# Patient Record
Sex: Female | Born: 1950 | Race: White | Hispanic: No | Marital: Married | Smoking: Never smoker
Health system: Southern US, Community
[De-identification: ages and names within clinical notes are randomized; demographics above are authoritative.]

## PROBLEM LIST (undated history)

## (undated) DIAGNOSIS — F32A Depression, unspecified: Secondary | ICD-10-CM

## (undated) DIAGNOSIS — I10 Essential (primary) hypertension: Secondary | ICD-10-CM

## (undated) DIAGNOSIS — Z91030 Bee allergy status: Secondary | ICD-10-CM

## (undated) DIAGNOSIS — F329 Major depressive disorder, single episode, unspecified: Secondary | ICD-10-CM

## (undated) DIAGNOSIS — F419 Anxiety disorder, unspecified: Secondary | ICD-10-CM

## (undated) DIAGNOSIS — G43909 Migraine, unspecified, not intractable, without status migrainosus: Secondary | ICD-10-CM

## (undated) HISTORY — DX: Major depressive disorder, single episode, unspecified: F32.9

## (undated) HISTORY — DX: Bee allergy status: Z91.030

## (undated) HISTORY — DX: Anxiety disorder, unspecified: F41.9

## (undated) HISTORY — DX: Depression, unspecified: F32.A

## (undated) HISTORY — DX: Essential (primary) hypertension: I10

## (undated) HISTORY — DX: Migraine, unspecified, not intractable, without status migrainosus: G43.909

---

## 1997-11-25 HISTORY — PX: FOOT SURGERY: SHX648

## 2000-01-17 ENCOUNTER — Ambulatory Visit (HOSPITAL_COMMUNITY): Admission: RE | Admit: 2000-01-17 | Discharge: 2000-01-17 | Payer: Self-pay | Admitting: Family Medicine

## 2000-01-17 ENCOUNTER — Encounter: Payer: Self-pay | Admitting: Family Medicine

## 2002-07-30 ENCOUNTER — Encounter: Payer: Self-pay | Admitting: Family Medicine

## 2002-07-30 ENCOUNTER — Encounter: Admission: RE | Admit: 2002-07-30 | Discharge: 2002-07-30 | Payer: Self-pay | Admitting: Family Medicine

## 2004-04-24 ENCOUNTER — Encounter: Admission: RE | Admit: 2004-04-24 | Discharge: 2004-04-24 | Payer: Self-pay | Admitting: Family Medicine

## 2005-06-21 ENCOUNTER — Other Ambulatory Visit: Admission: RE | Admit: 2005-06-21 | Discharge: 2005-06-21 | Payer: Self-pay | Admitting: Family Medicine

## 2006-08-29 ENCOUNTER — Other Ambulatory Visit: Admission: RE | Admit: 2006-08-29 | Discharge: 2006-08-29 | Payer: Self-pay | Admitting: Family Medicine

## 2007-03-06 ENCOUNTER — Encounter: Admission: RE | Admit: 2007-03-06 | Discharge: 2007-03-06 | Payer: Self-pay | Admitting: Family Medicine

## 2007-07-18 ENCOUNTER — Emergency Department (HOSPITAL_COMMUNITY): Admission: EM | Admit: 2007-07-18 | Discharge: 2007-07-19 | Payer: Self-pay | Admitting: Emergency Medicine

## 2007-12-30 ENCOUNTER — Emergency Department (HOSPITAL_COMMUNITY): Admission: EM | Admit: 2007-12-30 | Discharge: 2007-12-30 | Payer: Self-pay | Admitting: Emergency Medicine

## 2010-07-06 ENCOUNTER — Other Ambulatory Visit: Admission: RE | Admit: 2010-07-06 | Discharge: 2010-07-06 | Payer: Self-pay | Admitting: Family Medicine

## 2011-08-16 LAB — I-STAT 8, (EC8 V) (CONVERTED LAB)
BUN: 10
Bicarbonate: 23.4
Glucose, Bld: 125 — ABNORMAL HIGH
Hemoglobin: 15.3 — ABNORMAL HIGH
TCO2: 24
pCO2, Ven: 24 — ABNORMAL LOW
pH, Ven: 7.597 — ABNORMAL HIGH

## 2011-08-16 LAB — RAPID URINE DRUG SCREEN, HOSP PERFORMED
Barbiturates: NOT DETECTED
Benzodiazepines: NOT DETECTED
Cocaine: NOT DETECTED
Opiates: NOT DETECTED

## 2011-08-16 LAB — POCT I-STAT CREATININE: Creatinine, Ser: 0.9

## 2011-09-06 LAB — URINALYSIS, ROUTINE W REFLEX MICROSCOPIC
Bilirubin Urine: NEGATIVE
Glucose, UA: NEGATIVE
Hgb urine dipstick: NEGATIVE
Protein, ur: NEGATIVE

## 2011-09-06 LAB — URINE MICROSCOPIC-ADD ON

## 2014-03-21 ENCOUNTER — Ambulatory Visit
Admission: RE | Admit: 2014-03-21 | Discharge: 2014-03-21 | Disposition: A | Discharge status: Home / Self Care | Payer: BC Managed Care – PPO | Source: Ambulatory Visit | Attending: Family Medicine | Admitting: Family Medicine

## 2014-03-21 ENCOUNTER — Other Ambulatory Visit: Payer: Self-pay | Admitting: Family Medicine

## 2014-03-21 DIAGNOSIS — M2550 Pain in unspecified joint: Secondary | ICD-10-CM

## 2015-05-02 ENCOUNTER — Other Ambulatory Visit: Payer: Self-pay | Admitting: Family Medicine

## 2015-05-02 ENCOUNTER — Other Ambulatory Visit (HOSPITAL_COMMUNITY)
Admission: RE | Admit: 2015-05-02 | Discharge: 2015-05-02 | Disposition: A | Discharge status: Home / Self Care | Payer: BC Managed Care – PPO | Source: Ambulatory Visit | Attending: Family Medicine | Admitting: Family Medicine

## 2015-05-02 DIAGNOSIS — Z01419 Encounter for gynecological examination (general) (routine) without abnormal findings: Secondary | ICD-10-CM | POA: Diagnosis present

## 2015-05-02 DIAGNOSIS — Z1151 Encounter for screening for human papillomavirus (HPV): Secondary | ICD-10-CM | POA: Diagnosis present

## 2015-05-04 LAB — CYTOLOGY - PAP

## 2015-05-15 ENCOUNTER — Other Ambulatory Visit: Payer: Self-pay | Admitting: Family Medicine

## 2015-05-15 DIAGNOSIS — Z1231 Encounter for screening mammogram for malignant neoplasm of breast: Secondary | ICD-10-CM

## 2015-05-31 ENCOUNTER — Ambulatory Visit
Admission: RE | Admit: 2015-05-31 | Discharge: 2015-05-31 | Disposition: A | Discharge status: Home / Self Care | Payer: BC Managed Care – PPO | Source: Ambulatory Visit | Attending: Family Medicine | Admitting: Family Medicine

## 2015-05-31 DIAGNOSIS — Z1231 Encounter for screening mammogram for malignant neoplasm of breast: Secondary | ICD-10-CM

## 2016-06-10 ENCOUNTER — Other Ambulatory Visit: Payer: Self-pay | Admitting: Oncology

## 2018-02-05 ENCOUNTER — Encounter: Payer: Self-pay | Admitting: Allergy & Immunology

## 2018-02-05 ENCOUNTER — Ambulatory Visit: Payer: BC Managed Care – PPO | Admitting: Allergy & Immunology

## 2018-02-05 VITALS — BP 120/72 | HR 60 | Resp 16 | Ht 61.5 in | Wt 132.8 lb

## 2018-02-05 DIAGNOSIS — J3089 Other allergic rhinitis: Secondary | ICD-10-CM

## 2018-02-05 DIAGNOSIS — J302 Other seasonal allergic rhinitis: Secondary | ICD-10-CM

## 2018-02-05 NOTE — Progress Notes (Signed)
NEW PATIENT  Date of Service/Encounter:  02/05/18  Referring provider: Merri Brunette, MD   Assessment:   Seasonal and perennial allergic rhinitis (grasses, outdoor molds, dog and cockroach)  Fatigue  Plan/Recommendations:   1. Seasonal and perennial allergic rhinitis - Testing today showed: grasses, outdoor molds, dog and cockroach - Avoidance measures provided. - Continue with: Claritin (loratadine) 10mg  tablet once daily as needed. - You can use an extra dose of the antihistamine, if needed, for breakthrough symptoms.  - Consider nasal saline rinses 1-2 times daily to remove allergens from the nasal cavities as well as help with mucous clearance (this is especially helpful to do before the nasal sprays are given) - Although Ms. Southgate is allergic to mold, they are all of the outdoor variety and not the indoor variety. - Ms. Yellin also seems to be rather sensitive to scents and other fragrances, which result in marked congestion and headaches.  - Consider the use of room air purifier with a HEPA filter to try to control your local environment.  - We are not treating with anything else aside from air purification and Claritin since Ms. Aspinwall is not interested in any medications whatsoever aside from antihistamines.   2. Return if symptoms worsen or fail to improve.  Subjective:   Sherri Atkins is a 67 y.o. female presenting today for evaluation of  Chief Complaint  Patient presents with  . Nasal Congestion  . Burning Eyes  . Nausea  . Headache    Sherri Atkins has a history of the following: There are no active problems to display for this patient.   History obtained from: chart review and patient.  Sherri Atkins was referred by Merri Brunette, MD.     Sherri Atkins is a 67 y.o. female presenting for an allergy evaluation. 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 She is currently working at [employer]. This past Monday  March 4th, there was an unusual smell in the building. It was very nauseating It smelled like a gas like, eucalyptus, cat litter scent. This seemed to make her head become congested. She had a headache and immediate fatigue. This week she ended up taking a sick day every other day. Symptoms recovered once she got home. She had to go outside to "clear [her] lungs". Then on Friday of that week she stayed all day and had no problems. She did feel badly in the morning but got better in the afternoon. She has felt better this week in general. She did report some breathing problems during this time, but she has no history of asthma whatsoever. She has gotten migraines from mold in the past, but typically it is just a headache.  [employer] does have some leaking in the building. 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 There are others in the building who have experienced similar symptoms. She has worked there for around seven months now. She was previously at [employer]. 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 She tends to follow her students around the school system.   She did not treat it with any medications at all since she did not know the etiology of the symptoms. When she does have some sinus pain, she tries to drink hot tea, ginger, and mint. She tries to stay away from the OTC allergy medications because they tend to dry her out. She has been using natural remedies. She tolerates all of the major food  allergens without adverse event.   Otherwise, there is no history of other atopic diseases, including asthma, drug allergies, food allergies, stinging insect allergies, or urticaria. There is no significant infectious history. Vaccinations are up to date.    Past Medical History: There are no active problems to display for this patient.   Medication List:  Allergies as of 02/05/2018      Reactions   Doxycycline Nausea Only   Lisinopril Swelling   Rexulti  [brexpiprazole] Other (See Comments)   Memory loss   Septra [sulfamethoxazole-trimethoprim] Other (See Comments)   heartburn   Neosporin [neomycin-bacitracin Zn-polymyx] Rash      Medication List        Accurate as of 02/05/18  9:29 PM. Always use your most recent med list.          amLODipine 5 MG tablet Commonly known as:  NORVASC Take 5 mg by mouth daily.   BENEFIBER DRINK MIX PO Take 1 Dose by mouth as needed.   CALCIUM-VITAMINS C & D PO Take 1 tablet by mouth daily.   EPIPEN 2-PAK 0.3 mg/0.3 mL Soaj injection Generic drug:  EPINEPHrine Inject 0.3 mg into the muscle once.   FISH OIL PO Take 1 capsule by mouth daily.   hydrochlorothiazide 12.5 MG capsule Commonly known as:  MICROZIDE Take 12.5 mg by mouth daily.   MAGNESIA PO Take 2 tablets by mouth daily.   naproxen 250 MG tablet Commonly known as:  NAPROSYN Take by mouth 2 (two) times daily with a meal.   rizatriptan 10 MG tablet Commonly known as:  MAXALT Take 10 mg by mouth as needed for migraine. May repeat in 2 hours if needed   sertraline 100 MG tablet Commonly known as:  ZOLOFT Take 100 mg by mouth daily.   traZODone 25 mg Tabs tablet Commonly known as:  DESYREL Take 25 mg by mouth at bedtime.   ZINC PO Take 1 tablet by mouth daily.       Birth History: non-contributory.   Developmental History: non-contributory.   Past Surgical History: Past Surgical History:  Procedure Laterality Date  . FOOT SURGERY  1999     Family History: Family History  Problem Relation Age of Onset  . Depression Mother   . Hernia Mother   . Constipation Mother   . Dementia Mother   . Lung cancer Father   . Depression Sister   . Asthma Brother   . Esophageal cancer Brother   . Depression Sister    Ms. Slomski's son is allergic to cashews. Her grand-daughter is allergic to tree nuts.    Social History: Sherri Atkins lives at home with her husband. She works with hearing impaired students within the  school system. Her husband works from home. There is wood flooring in the main living areas. They here is gas heating and central cooling. There is one cat in the home. There are no dust mite coverings on her bedding. There is no tobacco exposure in the home. 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     Review of Systems: a 14-point review of systems is pertinent for what is mentioned in HPI.  Otherwise, all other systems were negative. Constitutional: negative other than that listed in the HPI Eyes: negative other than that listed in the HPI Ears, nose, mouth, throat, and face: negative other than that listed in the HPI Respiratory: negative other than that listed in the HPI Cardiovascular: negative other than that listed  in the HPI Gastrointestinal: negative other than that listed in the HPI Genitourinary: negative other than that listed in the HPI Integument: negative other than that listed in the HPI Hematologic: negative other than that listed in the HPI Musculoskeletal: negative other than that listed in the HPI Neurological: negative other than that listed in the HPI Allergy/Immunologic: negative other than that listed in the HPI    Objective:   Blood pressure 120/72, pulse 60, resp. rate 16, height 5' 1.5" (1.562 m), weight 132 lb 12.8 oz (60.2 kg). Body mass index is 24.69 kg/m.   Physical Exam:  General: Alert, interactive, in no acute distress. Pleasant female                                                                                       2Eyes: No conjunctival injection bilaterally, no discharge on the right, no discharge on the left and no Horner-Trantas dots present. PERRL bilaterally. EOMI without pain. No photophobia.  Ears: Right TM pearly gray with normal light reflex, Left TM pearly gray with normal light reflex, Right TM intact without perforation and Left TM intact without perforation.  Nose/Throat: External nose  within normal limits and septum midline. Turbinates minimally edematous without discharge. Posterior oropharynx mildly erythematous without cobblestoning in the posterior oropharynx. Tonsils 2+ without exudates.  Tongue without thrush. Neck: Supple without thyromegaly. Trachea midline. Adenopathy: no enlarged lymph nodes appreciated in the anterior cervical, occipital, axillary, epitrochlear, inguinal, or popliteal regions. Lungs: Clear to auscultation without wheezing, rhonchi or rales. No increased work of breathing. CV: Normal S1/S2. No murmurs. Capillary refill <2 seconds.  Abdomen: Nondistended, nontender. No guarding or rebound tenderness. Bowel sounds 2  Skin: Warm and dry, without lesions or rashes. Extremities:  No clubbing, cyanosis or edema. Neuro:   Grossly intact. No focal deficits appreciated. Responsive to questions.  Diagnostic studies:\   Allergy Studies:   Indoor/Outdoor Percutaneous Adult Environmental Panel:negative to the entire panel with adequate controls.  Indoor/Outdoor Percutaneous Pediatric Environmental Panel:negative to the entire panel with adequate controls.  Indoor/Outdoor Selected Intradermal Environmental Panel: positive to [place] grass, Grass mix, mold mix #3, dog and cockroach. Otherwise negative with adequate controls.  Allergy testing results were read and interpreted by myself, documented by clinical staff.     Malachi BondsJoel Angila Wombles, MD [facility]

## 2018-02-05 NOTE — Patient Instructions (Addendum)
1. Seasonal and perennial allergic rhinitis - Testing today showed: grasses, outdoor molds, dog and cockroach - Avoidance measures provided. - Continue with: Claritin (loratadine) 10mg  tablet once daily as needed. - You can use an extra dose of the antihistamine, if needed, for breakthrough symptoms.  - Consider nasal saline rinses 1-2 times daily to remove allergens from the nasal cavities as well as help with mucous clearance (this is especially helpful to do before the nasal sprays are given) - Consider the use of room air purifier with a HEPA filter to try to control your local environment.   2. Return if symptoms worsen or fail to improve.   Please inform us of any Emergency Department visits, hospitalizations, or changes in symptoms. Call us before going to the ED for breathing or allergy symptoms since we might be able to fit you in for a sick visit. Feel free to contact us anytime with any questions, problems, or concerns.  It was a pleasure to meet you today!  Websites that have reliable patient information: 1. American Academy of Asthma, Allergy, and Immunology: www.aaaai.org 2. Food Allergy Research and Education (FARE): foodallergy.org 3. Mothers of Asthmatics: http://www.asthmacommunitynetwork.org 4. American College of Allergy, Asthma, and Immunology: www.acaai.org  Reducing Pollen Exposure  The American Academy of Allergy, Asthma and Immunology suggests the following steps to reduce your exposure to pollen during allergy seasons.    1. Do not hang sheets or clothing out to dry; pollen may collect on these items. 2. Do not mow lawns or spend time around freshly cut grass; mowing stirs up pollen. 3. Keep windows closed at night.  Keep car windows closed while driving. 4. Minimize morning activities outdoors, a time when pollen counts are usually at their highest. 5. Stay indoors as much as possible when pollen counts or humidity is high and on windy days when pollen tends to  remain in the air longer. 6. Use air conditioning when possible.  Many air conditioners have filters that trap the pollen spores. 7. Use a HEPA room air filter to remove pollen form the indoor air you breathe.  Control of Mold Allergen   Mold and fungi can grow on a variety of surfaces provided certain temperature and moisture conditions exist.  Outdoor molds grow on plants, decaying vegetation and soil.  The major outdoor mold, Alternaria and Cladosporium, are found in very high numbers during hot and dry conditions.  Generally, a late Summer - Fall peak is seen for common outdoor fungal spores.  Rain will temporarily lower outdoor mold spore count, but counts rise rapidly when the rainy period ends.  The most important indoor molds are Aspergillus and Penicillium.  Dark, humid and poorly ventilated basements are ideal sites for mold growth.  The next most common sites of mold growth are the bathroom and the kitchen.  Outdoor (Seasonal) Mold Control  Positive outdoor molds via skin testing: Alternaria, Cladosporium, Bipolaris (Helminthsporium), Drechslera (Curvalaria) and Mucor  1. Use air conditioning and keep windows closed 2. Avoid exposure to decaying vegetation. 3. Avoid leaf raking. 4. Avoid grain handling. 5. Consider wearing a face mask if working in moldy areas.    Control of Cockroach Allergen  Cockroach allergen has been identified as an important cause of acute attacks of asthma, especially in urban settings.  There are fifty-five species of cockroach that exist in the [place], however only three, the [place], [place] and Oriental species produce allergen that can affect patients with Asthma.  Allergens can be obtained from  fecal particles, egg casings and secretions from cockroaches.    1. Remove food sources. 2. Reduce access to water. 3. Seal access and entry points. 4. Spray runways with 0.5-1% Diazinon or Chlorpyrifos 5. Blow boric acid power under stoves and  refrigerator. 6. Place bait stations (hydramethylnon) at feeding sites.  Control of Dog or Cat Allergen  Avoidance is the best way to manage a dog or cat allergy. If you have a dog or cat and are allergic to dog or cats, consider removing the dog or cat from the home. If you have a dog or cat but don't want to find it a new home, or if your family wants a pet even though someone in the household is allergic, here are some strategies that may help keep symptoms at bay:  1. Keep the pet out of your bedroom and restrict it to only a few rooms. Be advised that keeping the dog or cat in only one room will not limit the allergens to that room. 2. Don't pet, hug or kiss the dog or cat; if you do, wash your hands with soap and water. 3. High-efficiency particulate air (HEPA) cleaners run continuously in a bedroom or living room can reduce allergen levels over time. 4. Regular use of a high-efficiency vacuum cleaner or a central vacuum can reduce allergen levels. 5. Giving your dog or cat a bath at least once a week can reduce airborne allergen.

## 2018-12-18 ENCOUNTER — Other Ambulatory Visit: Payer: Self-pay | Admitting: Family Medicine

## 2018-12-18 DIAGNOSIS — M8580 Other specified disorders of bone density and structure, unspecified site: Secondary | ICD-10-CM

## 2018-12-31 ENCOUNTER — Other Ambulatory Visit: Payer: Self-pay | Admitting: Family Medicine

## 2018-12-31 DIAGNOSIS — Z1231 Encounter for screening mammogram for malignant neoplasm of breast: Secondary | ICD-10-CM

## 2019-02-25 ENCOUNTER — Other Ambulatory Visit: Payer: BC Managed Care – PPO

## 2019-02-25 ENCOUNTER — Ambulatory Visit: Payer: BC Managed Care – PPO

## 2019-05-12 ENCOUNTER — Ambulatory Visit
Admission: RE | Admit: 2019-05-12 | Discharge: 2019-05-12 | Disposition: A | Discharge status: Home / Self Care | Payer: BC Managed Care – PPO | Source: Ambulatory Visit | Attending: Family Medicine | Admitting: Family Medicine

## 2019-05-12 ENCOUNTER — Other Ambulatory Visit: Payer: BC Managed Care – PPO

## 2019-05-12 ENCOUNTER — Other Ambulatory Visit: Payer: Self-pay

## 2019-05-12 DIAGNOSIS — Z1231 Encounter for screening mammogram for malignant neoplasm of breast: Secondary | ICD-10-CM

## 2019-06-17 ENCOUNTER — Other Ambulatory Visit: Payer: Self-pay

## 2019-06-17 ENCOUNTER — Ambulatory Visit
Admission: RE | Admit: 2019-06-17 | Discharge: 2019-06-17 | Disposition: A | Discharge status: Home / Self Care | Payer: BC Managed Care – PPO | Source: Ambulatory Visit | Attending: Family Medicine | Admitting: Family Medicine

## 2019-06-17 DIAGNOSIS — M8580 Other specified disorders of bone density and structure, unspecified site: Secondary | ICD-10-CM

## 2019-12-20 ENCOUNTER — Other Ambulatory Visit: Payer: Self-pay | Admitting: Family Medicine

## 2019-12-20 ENCOUNTER — Other Ambulatory Visit (HOSPITAL_COMMUNITY)
Admission: RE | Admit: 2019-12-20 | Discharge: 2019-12-20 | Disposition: A | Discharge status: Home / Self Care | Payer: Medicare HMO | Source: Ambulatory Visit | Attending: Family Medicine | Admitting: Family Medicine

## 2019-12-20 DIAGNOSIS — M19041 Primary osteoarthritis, right hand: Secondary | ICD-10-CM | POA: Diagnosis not present

## 2019-12-20 DIAGNOSIS — M19042 Primary osteoarthritis, left hand: Secondary | ICD-10-CM | POA: Diagnosis not present

## 2019-12-20 DIAGNOSIS — Z0000 Encounter for general adult medical examination without abnormal findings: Secondary | ICD-10-CM | POA: Diagnosis not present

## 2019-12-20 DIAGNOSIS — Z124 Encounter for screening for malignant neoplasm of cervix: Secondary | ICD-10-CM | POA: Insufficient documentation

## 2019-12-20 DIAGNOSIS — Z1151 Encounter for screening for human papillomavirus (HPV): Secondary | ICD-10-CM | POA: Insufficient documentation

## 2019-12-20 DIAGNOSIS — I10 Essential (primary) hypertension: Secondary | ICD-10-CM | POA: Diagnosis not present

## 2019-12-20 DIAGNOSIS — Z1211 Encounter for screening for malignant neoplasm of colon: Secondary | ICD-10-CM | POA: Diagnosis not present

## 2019-12-20 DIAGNOSIS — F339 Major depressive disorder, recurrent, unspecified: Secondary | ICD-10-CM | POA: Diagnosis not present

## 2019-12-20 DIAGNOSIS — R079 Chest pain, unspecified: Secondary | ICD-10-CM | POA: Diagnosis not present

## 2019-12-20 DIAGNOSIS — Z7189 Other specified counseling: Secondary | ICD-10-CM | POA: Diagnosis not present

## 2019-12-22 LAB — CYTOLOGY - PAP
Comment: NEGATIVE
Diagnosis: NEGATIVE
High risk HPV: NEGATIVE

## 2019-12-28 ENCOUNTER — Telehealth: Payer: Self-pay | Admitting: *Deleted

## 2019-12-28 NOTE — Telephone Encounter (Signed)
Left  Message,re: her new patient appointment.

## 2020-01-09 NOTE — Progress Notes (Signed)
Cardiology Office Note:    Date:  01/10/2020   ID:  Sherri Atkins, DOB 14-Apr-1951, MRN [id number]  PCP:  Sherri Brunette, MD  Cardiologist:  No primary care provider on file.  Electrophysiologist:  None   Referring MD: Sherri Montana, MD   Chief Complaint  Patient presents with  . Chest Pain    History of Present Illness:    Sherri Atkins is a 69 y.o. female with a hx of hypertension, anxiety, depression who is referred by Dr. Cliffton Asters for evaluation of chest pain.  She reports that chest pain started in the last few months.  Describes pressure in center of her chest, occurs when she is stressed.  Also feels short of breath.  Has been occurring a couple times per week.  In addition she has a second type of chest pain that she describes as occasional dull aching pain.  For exercise, she does gardening and will walk for 20 to 40 minutes about twice per week.  She denies any exertional symptoms.  Prescribed omeprazole for GERD, but has not started yet.  Reports BP under good control.  LDL 115 on 12/20/19.  Never smoked.  No family history heart disease.    Past Medical History:  Diagnosis Date  . Anxiety   . Bee sting allergy   . Depression   . High blood pressure   . Migraines     Past Surgical History:  Procedure Laterality Date  . FOOT SURGERY  1999    Current Medications: Current Meds  Medication Sig  . amLODipine (NORVASC) 5 MG tablet Take 5 mg by mouth daily.  Marland Kitchen CALCIUM-VITAMINS C & D PO Take 1 tablet by mouth daily.  Marland Kitchen EPINEPHrine (EPIPEN 2-PAK) 0.3 mg/0.3 mL IJ SOAJ injection Inject 0.3 mg into the muscle once.  . hydrochlorothiazide (MICROZIDE) 12.5 MG capsule Take 12.5 mg by mouth daily.  . Multiple Vitamins-Minerals (ZINC PO) Take 1 tablet by mouth daily.  . naproxen (NAPROSYN) 250 MG tablet Take by mouth 2 (two) times daily with a meal.  . Omega-3 Fatty Acids (FISH OIL PO) Take 1 capsule by mouth daily.  . rizatriptan (MAXALT) 10 MG tablet Take 10 mg by mouth  as needed for migraine. May repeat in 2 hours if needed  . sertraline (ZOLOFT) 100 MG tablet Take 100 mg by mouth daily.  . traZODone (DESYREL) 25 mg TABS tablet Take 25 mg by mouth at bedtime.  . Wheat Dextrin (BENEFIBER DRINK MIX PO) Take 1 Dose by mouth as needed.     Allergies:   Doxycycline, Lisinopril, Rexulti [brexpiprazole], Septra [sulfamethoxazole-trimethoprim], and Neosporin [neomycin-bacitracin zn-polymyx]   Social History   Socioeconomic History  . Marital status: Legally Separated    Spouse name: Not on file  . Number of children: Not on file  . Years of education: Not on file  . Highest education level: Not on file  Occupational History  . Not on file  Tobacco Use  . Smoking status: Never Smoker  . Smokeless tobacco: Never Used  Substance and Sexual Activity  . Alcohol use: No  . Drug use: No  . Sexual activity: Not on file  Other Topics Concern  . Not on file  Social History Narrative  . Not on file   Social Determinants of Health   Financial Resource Strain:   . Difficulty of Paying Living Expenses: Not on file  Food Insecurity:   . Worried About Programme researcher, broadcasting/film/video in the Last Year: Not on file  .  Ran Out of Food in the Last Year: Not on file  Transportation Needs:   . Lack of Transportation (Medical): Not on file  . Lack of Transportation (Non-Medical): Not on file  Physical Activity:   . Days of Exercise per Week: Not on file  . Minutes of Exercise per Session: Not on file  Stress:   . Feeling of Stress : Not on file  Social Connections:   . Frequency of Communication with Friends and Family: Not on file  . Frequency of Social Gatherings with Friends and Family: Not on file  . Attends Religious Services: Not on file  . Active Member of Clubs or Organizations: Not on file  . Attends Banker Meetings: Not on file  . Marital Status: Not on file     Family History: The patient's family history includes Asthma in her brother;  Constipation in her mother; Dementia in her mother; Depression in her mother, sister, and sister; Esophageal cancer in her brother; Hernia in her mother; Lung cancer in her father.  ROS:   Please see the history of present illness.     All other systems reviewed and are negative.  EKGs/Labs/Other Studies Reviewed:    The following studies were reviewed today:   EKG:  EKG is ordered today.  The ekg ordered today demonstrates normal sinus rhythm, rate 65, no ST/T abnormalities  Recent Labs: No results found for requested labs within last 8760 hours.  Recent Lipid Panel No results found for: CHOL, TRIG, HDL, CHOLHDL, VLDL, LDLCALC, LDLDIRECT  Physical Exam:    VS:  BP 122/78 (BP Location: Left Arm)   Pulse 66   Ht 5\' 2"  (1.575 m)   Wt 135 lb 9.6 oz (61.5 kg)   SpO2 99%   BMI 24.80 kg/m     Wt Readings from Last 3 Encounters:  01/10/20 135 lb 9.6 oz (61.5 kg)  02/05/18 132 lb 12.8 oz (60.2 kg)     GEN:  Well nourished, well developed in no acute distress HEENT: Normal NECK: No JVD; No carotid bruits CARDIAC: RRR, no murmurs, rubs, gallops RESPIRATORY:  Clear to auscultation without rales, wheezing or rhonchi  ABDOMEN: Soft, non-tender, non-distended MUSCULOSKELETAL:  No edema; No deformity  SKIN: Warm and dry NEUROLOGIC:  Alert and oriented x 3 PSYCHIATRIC:  Normal affect   ASSESSMENT:    1. Chest pain, unspecified type   2. Pre-procedure lab exam   3. Essential hypertension    PLAN:    In order of problems listed above:  Chest pain: Atypical in description, but given risk factors (age, hypertension) would classify as intermediate risk of obstructive coronary disease and warrants further evaluation. -Coronary CTA -TTE  Hypertension: On amlodipine 5 mg daily and hydrochlorothiazide 12.5 mg daily.  Appears well controlled  RTC in 3 months.  Medication Adjustments/Labs and Tests Ordered: Current medicines are reviewed at length with the patient today.  Concerns  regarding medicines are outlined above.  Orders Placed This Encounter  Procedures  . CT CORONARY MORPH W/CTA COR W/SCORE W/CA W/CM &/OR WO/CM  . CT CORONARY FRACTIONAL FLOW RESERVE DATA PREP  . CT CORONARY FRACTIONAL FLOW RESERVE FLUID ANALYSIS  . Basic metabolic panel  . EKG 12-Lead  . ECHOCARDIOGRAM COMPLETE   Meds ordered this encounter  Medications  . metoprolol tartrate (LOPRESSOR) 50 MG tablet    Sig: Take 50 mg (1 tablet) TWO hours prior to CT    Dispense:  1 tablet    Refill:  0  Patient Instructions  Medication Instructions:  Your physician recommends that you continue on your current medications as directed. Please refer to the Current Medication list given to you today.  *If you need a refill on your cardiac medications before your next appointment, please call your pharmacy*  Lab Work: BMET ONE [place]  If you have labs (blood work) drawn today and your tests are completely normal, you will receive your results only by: Marland Kitchen MyChart Message (if you have MyChart) OR . A paper copy in the mail If you have any lab test that is abnormal or we need to change your treatment, we will call you to review the results.  Testing/Procedures: Your physician has requested that you have an echocardiogram. Echocardiography is a painless test that uses sound waves to create images of your heart. It provides your doctor with information about the size and shape of your heart and how well your heart's chambers and valves are working. This procedure takes approximately one hour. There are no restrictions for this procedure.  This will be done at our [facility] location:  [place] has requested that you have cardiac CT. Cardiac computed tomography (CT) is a painless test that uses an x-ray machine to take clear, detailed pictures of your heart. For further information please visit HugeFiesta.tn. Please follow instruction sheet as  given.  Follow-Up: At [facility], you and your health needs are our priority.  As part of our continuing mission to provide you with exceptional heart care, we have created designated Provider Care Teams.  These Care Teams include your primary Cardiologist (physician) and Advanced Practice Providers (APPs -  Physician Assistants and Nurse Practitioners) who all work together to provide you with the care you need, when you need it.  Your next appointment:   3 month(s)  The format for your next appointment:   In Person  Provider:   Oswaldo Milian, MD  Other Instructions   Your cardiac CT will be scheduled at one of the below locations:   [facility] [address] [place], [place] [address] [phone]  [place] [address] [place], [place] [address] [phone]  If scheduled at [facility], please arrive at the [facility] main entrance of [facility] 30 minutes prior to test start time. Proceed to the [facility] Radiology Department (first floor) to check-in and test prep.  If scheduled at [facility], please arrive 15 mins early for check-in and test prep.  Please follow these instructions carefully (unless otherwise directed):  Hold all erectile dysfunction medications at least 3 days (72 hrs) prior to test.  On the Night Before the Test: . Be sure to Drink plenty of water. . Do not consume any caffeinated/decaffeinated beverages or chocolate 12 hours prior to your test. . Do not take any antihistamines 12 hours prior to your test. . If you take Metformin do not take 24 hours prior to test. . If the patient has contrast allergy: ? Patient will need a prescription for Prednisone and very clear instructions (as follows): 1. Prednisone 50 mg - take 13 hours prior to test 2. Take another Prednisone 50 mg 7 hours prior to test 3. Take another  Prednisone 50 mg 1 hour prior to test 4. Take Benadryl 50 mg 1 hour prior to test . Patient must complete all four doses of above prophylactic  medications. . Patient will need a ride after test due to Benadryl.  On the Day of the Test: . Drink plenty of water. Do not drink any water within one hour of the test. . Do not eat any food 4 hours prior to the test. . You may take your regular medications prior to the test.  . Take metoprolol (Lopressor) two hours prior to test. . HOLD Furosemide/Hydrochlorothiazide morning of the test. . FEMALES- please wear underwire-free bra if available   After the Test: . Drink plenty of water. . After receiving IV contrast, you may experience a mild flushed feeling. This is normal. . On occasion, you may experience a mild rash up to 24 hours after the test. This is not dangerous. If this occurs, you can take Benadryl 25 mg and increase your fluid intake. . If you experience trouble breathing, this can be serious. If it is severe call 911 IMMEDIATELY. If it is mild, please call our office. . If you take any of these medications: Glipizide/Metformin, Avandament, Glucavance, please do not take 48 hours after completing test unless otherwise instructed.   Once we have confirmed authorization from your insurance company, we will call you to set up a date and time for your test.   For non-scheduling related questions, please contact the cardiac imaging nurse navigator should you have any questions/concerns: Rockwell Alexandria, RN Navigator Cardiac Imaging [facility] [phone] mobile       Signed, [name], MD  01/10/2020 9:36 AM    [facility]

## 2020-01-10 ENCOUNTER — Encounter (INDEPENDENT_AMBULATORY_CARE_PROVIDER_SITE_OTHER): Payer: Self-pay

## 2020-01-10 ENCOUNTER — Other Ambulatory Visit: Payer: Self-pay

## 2020-01-10 ENCOUNTER — Encounter: Payer: Self-pay | Admitting: Cardiology

## 2020-01-10 ENCOUNTER — Ambulatory Visit (HOSPITAL_COMMUNITY): Payer: Medicare HMO | Attending: Cardiology

## 2020-01-10 ENCOUNTER — Ambulatory Visit: Payer: Medicare HMO | Admitting: Cardiology

## 2020-01-10 VITALS — BP 122/78 | HR 66 | Ht 62.0 in | Wt 135.6 lb

## 2020-01-10 DIAGNOSIS — Z01812 Encounter for preprocedural laboratory examination: Secondary | ICD-10-CM

## 2020-01-10 DIAGNOSIS — I10 Essential (primary) hypertension: Secondary | ICD-10-CM

## 2020-01-10 DIAGNOSIS — R079 Chest pain, unspecified: Secondary | ICD-10-CM | POA: Diagnosis not present

## 2020-01-10 LAB — ECHOCARDIOGRAM COMPLETE
Height: 62 in
Weight: 2169.6 oz

## 2020-01-10 MED ORDER — METOPROLOL TARTRATE 50 MG PO TABS: ORAL_TABLET | ORAL | 0 refills | Status: DC

## 2020-01-10 NOTE — Patient Instructions (Addendum)
Medication Instructions:  Your physician recommends that you continue on your current medications as directed. Please refer to the Current Medication list given to you today.  *If you need a refill on your cardiac medications before your next appointment, please call your pharmacy*  Lab Work: BMET ONE [place]  If you have labs (blood work) drawn today and your tests are completely normal, you will receive your results only by: Marland Kitchen MyChart Message (if you have MyChart) OR . A paper copy in the mail If you have any lab test that is abnormal or we need to change your treatment, we will call you to review the results.  Testing/Procedures: Your physician has requested that you have an echocardiogram. Echocardiography is a painless test that uses sound waves to create images of your heart. It provides your doctor with information about the size and shape of your heart and how well your heart's chambers and valves are working. This procedure takes approximately one hour. There are no restrictions for this procedure.  This will be done at our [facility] location:  [place] has requested that you have cardiac CT. Cardiac computed tomography (CT) is a painless test that uses an x-ray machine to take clear, detailed pictures of your heart. For further information please visit HugeFiesta.tn. Please follow instruction sheet as given.  Follow-Up: At [facility], you and your health needs are our priority.  As part of our continuing mission to provide you with exceptional heart care, we have created designated Provider Care Teams.  These Care Teams include your primary Cardiologist (physician) and Advanced Practice Providers (APPs -  Physician Assistants and Nurse Practitioners) who all work together to provide you with the care you need, when you need it.  Your next appointment:   3 month(s)  The format for your next appointment:   In  Person  Provider:   Oswaldo Milian, MD  Other Instructions   Your cardiac CT will be scheduled at one of the below locations:   [facility] [address] [place], [place] [address] [phone]  [place] [address] [place], [place] [address] [phone]  If scheduled at [facility], please arrive at the [facility] main entrance of [facility] 30 minutes prior to test start time. Proceed to the [facility] Radiology Department (first floor) to check-in and test prep.  If scheduled at [facility], please arrive 15 mins early for check-in and test prep.  Please follow these instructions carefully (unless otherwise directed):  Hold all erectile dysfunction medications at least 3 days (72 hrs) prior to test.  On the Night Before the Test: . Be sure to Drink plenty of water. . Do not consume any caffeinated/decaffeinated beverages or chocolate 12 hours prior to your test. . Do not take any antihistamines 12 hours prior to your test. . If you take Metformin do not take 24 hours prior to test. . If the patient has contrast allergy: ? Patient will need a prescription for Prednisone and very clear instructions (as follows): 1. Prednisone 50 mg - take 13 hours prior to test 2. Take another Prednisone 50 mg 7 hours prior to test 3. Take another Prednisone 50 mg 1 hour prior to test 4. Take Benadryl 50 mg 1 hour prior to test . Patient must complete all four doses of above prophylactic medications. . Patient will  need a ride after test due to Benadryl.  On the Day of the Test: . Drink plenty of water. Do not drink any water within one hour of the test. . Do not eat any food 4 hours prior to the test. . You may take your regular medications prior to the test.  . Take metoprolol (Lopressor) two hours prior to test. . HOLD Furosemide/Hydrochlorothiazide morning of  the test. . FEMALES- please wear underwire-free bra if available   After the Test: . Drink plenty of water. . After receiving IV contrast, you may experience a mild flushed feeling. This is normal. . On occasion, you may experience a mild rash up to 24 hours after the test. This is not dangerous. If this occurs, you can take Benadryl 25 mg and increase your fluid intake. . If you experience trouble breathing, this can be serious. If it is severe call 911 IMMEDIATELY. If it is mild, please call our office. . If you take any of these medications: Glipizide/Metformin, Avandament, Glucavance, please do not take 48 hours after completing test unless otherwise instructed.   Once we have confirmed authorization from your insurance company, we will call you to set up a date and time for your test.   For non-scheduling related questions, please contact the cardiac imaging nurse navigator should you have any questions/concerns: Rockwell Alexandria, RN Navigator Cardiac Imaging [facility] [phone] mobile

## 2020-01-26 DIAGNOSIS — F411 Generalized anxiety disorder: Secondary | ICD-10-CM | POA: Diagnosis not present

## 2020-01-26 DIAGNOSIS — Z630 Problems in relationship with spouse or partner: Secondary | ICD-10-CM | POA: Diagnosis not present

## 2020-01-26 DIAGNOSIS — F332 Major depressive disorder, recurrent severe without psychotic features: Secondary | ICD-10-CM | POA: Diagnosis not present

## 2020-02-08 ENCOUNTER — Telehealth: Payer: Self-pay | Admitting: Cardiology

## 2020-02-08 DIAGNOSIS — Z01812 Encounter for preprocedural laboratory examination: Secondary | ICD-10-CM | POA: Diagnosis not present

## 2020-02-08 DIAGNOSIS — R079 Chest pain, unspecified: Secondary | ICD-10-CM | POA: Diagnosis not present

## 2020-02-08 NOTE — Telephone Encounter (Signed)
Lm to call back ./cy 

## 2020-02-08 NOTE — Telephone Encounter (Signed)
Patient returning call.

## 2020-02-08 NOTE — Telephone Encounter (Signed)
Contacted patient, she had questions regarding her BMET (she did not have to fast for this), and her CT coming up.  I answered questions per patient.  She was thankful for the call. No other questions at this time.

## 2020-02-08 NOTE — Telephone Encounter (Signed)
Follow Up:   Pt said she had lab work this morning here at the lab. She said she took her blood pressure medicine with a sip of ginger ale. She wants to know if that was alright?

## 2020-02-09 ENCOUNTER — Telehealth: Payer: Self-pay | Admitting: Cardiology

## 2020-02-09 LAB — BASIC METABOLIC PANEL
BUN/Creatinine Ratio: 21 (ref 12–28)
BUN: 19 mg/dL (ref 8–27)
CO2: 26 mmol/L (ref 20–29)
Calcium: 9.8 mg/dL (ref 8.7–10.3)
Chloride: 102 mmol/L (ref 96–106)
Creatinine, Ser: 0.89 mg/dL (ref 0.57–1.00)
GFR calc Af Amer: 78 mL/min/{1.73_m2} (ref >59)
GFR calc non Af Amer: 67 mL/min/{1.73_m2} (ref >59)
Glucose: 81 mg/dL (ref 65–99)
Potassium: 5.1 mmol/L (ref 3.5–5.2)
Sodium: 141 mmol/L (ref 134–144)

## 2020-02-09 NOTE — Telephone Encounter (Signed)
Spoke with patient. Patient reports she may have read the caller ID wrong, believes the call was from yesterday but she already spoke with someone. No further questions at this time.

## 2020-02-09 NOTE — Telephone Encounter (Signed)
Patient states she is returning a call from this morning. She is unsure what it was about.

## 2020-02-15 ENCOUNTER — Encounter (HOSPITAL_COMMUNITY): Payer: Self-pay

## 2020-02-15 ENCOUNTER — Telehealth: Payer: Self-pay | Admitting: Cardiology

## 2020-02-15 NOTE — Telephone Encounter (Signed)
Returning phone call after patient called with questions about medications to take prior to CCTA appt tomorrow. States she went to pharm who said they did not receive rx for prednisone.  I explained to patient that prednisone/benadryl is only for patients who are allergic to IV contrast. I confirmed her allergies as listed in chart (which are up to date) and patient has received contrast in the past without issues.  Pt appreciated the follow up call.  Rockwell Alexandria RN Navigator Cardiac Imaging [facility] [phone] Office  [phone] Cell

## 2020-02-15 NOTE — Telephone Encounter (Signed)
  Pt said per instructions for her upcoming CT tomorrow she needs to take prednisone, however, she went and been calling her pharmacy and no prednisone was sent for her. She wanted to know if she needs to take for the test, also, she's confused if she needs to take benadryl before the CT.  Please call

## 2020-02-16 ENCOUNTER — Encounter (HOSPITAL_COMMUNITY): Payer: Self-pay

## 2020-02-16 ENCOUNTER — Ambulatory Visit (HOSPITAL_COMMUNITY)
Admission: RE | Admit: 2020-02-16 | Discharge: 2020-02-16 | Disposition: A | Discharge status: Home / Self Care | Payer: Medicare HMO | Source: Ambulatory Visit | Attending: Cardiology | Admitting: Cardiology

## 2020-02-16 ENCOUNTER — Other Ambulatory Visit: Payer: Self-pay

## 2020-02-16 DIAGNOSIS — I2510 Atherosclerotic heart disease of native coronary artery without angina pectoris: Secondary | ICD-10-CM | POA: Insufficient documentation

## 2020-02-16 DIAGNOSIS — R918 Other nonspecific abnormal finding of lung field: Secondary | ICD-10-CM | POA: Diagnosis not present

## 2020-02-16 DIAGNOSIS — I700 Atherosclerosis of aorta: Secondary | ICD-10-CM | POA: Insufficient documentation

## 2020-02-16 DIAGNOSIS — R079 Chest pain, unspecified: Secondary | ICD-10-CM | POA: Diagnosis not present

## 2020-02-16 MED ORDER — NITROGLYCERIN 0.4 MG SL SUBL
SUBLINGUAL_TABLET | SUBLINGUAL | Status: AC
  Filled 2020-02-16: qty 2

## 2020-02-16 MED ORDER — NITROGLYCERIN 0.4 MG SL SUBL
0.8000 mg | SUBLINGUAL_TABLET | Freq: Once | SUBLINGUAL | Status: AC
  Administered 2020-02-16: 13:00:00 0.8 mg via SUBLINGUAL

## 2020-02-16 MED ORDER — IOHEXOL 350 MG/ML SOLN
100.0000 mL | Freq: Once | INTRAVENOUS | Status: AC | PRN
  Administered 2020-02-16: 100 mL via INTRAVENOUS

## 2020-02-16 NOTE — Discharge Instructions (Signed)
Cardiac CT Angiogram A cardiac CT angiogram is a procedure to look at the heart and the area around the heart. It may be done to help find the cause of chest pains or other symptoms of heart disease. During this procedure, a substance called contrast dye is injected into the blood vessels in the area to be checked. A large X-ray machine, called a CT scanner, then takes detailed pictures of the heart and the surrounding area. The procedure is also sometimes called a coronary CT angiogram, coronary artery scanning, or CTA. A cardiac CT angiogram allows the health care provider to see how well blood is flowing to and from the heart. The health care provider will be able to see if there are any problems, such as:  Blockage or narrowing of the coronary arteries in the heart.  Fluid around the heart.  Signs of weakness or disease in the muscles, valves, and tissues of the heart. Tell a health care provider about:  Any allergies you have. This is especially important if you have had a previous allergic reaction to contrast dye.  All medicines you are taking, including vitamins, herbs, eye drops, creams, and over-the-counter medicines.  Any blood disorders you have.  Any surgeries you have had.  Any medical conditions you have.  Whether you are pregnant or may be pregnant.  Any anxiety disorders, chronic pain, or other conditions you have that may increase your stress or prevent you from lying still. What are the risks? Generally, this is a safe procedure. However, problems may occur, including:  Bleeding.  Infection.  Allergic reactions to medicines or dyes.  Damage to other structures or organs.  Kidney damage from the contrast dye that is used.  Increased risk of cancer from radiation exposure. This risk is low. Talk with your health care provider about: ? The risks and benefits of testing. ? How you can receive the lowest dose of radiation. What happens before the  procedure?  Wear comfortable clothing and remove any jewelry, glasses, dentures, and hearing aids.  Follow instructions from your health care provider about eating and drinking. This may include: ? For 12 hours before the procedure -- avoid caffeine. This includes tea, coffee, soda, energy drinks, and diet pills. Drink plenty of water or other fluids that do not have caffeine in them. Being well hydrated can prevent complications. ? For 4-6 hours before the procedure -- stop eating and drinking. The contrast dye can cause nausea, but this is less likely if your stomach is empty.  Ask your health care provider about changing or stopping your regular medicines. This is especially important if you are taking diabetes medicines, blood thinners, or medicines to treat problems with erections (erectile dysfunction). What happens during the procedure?   Hair on your chest may need to be removed so that small sticky patches called electrodes can be placed on your chest. These will transmit information that helps to monitor your heart during the procedure.  An IV will be inserted into one of your veins.  You might be given a medicine to control your heart rate during the procedure. This will help to ensure that good images are obtained.  You will be asked to lie on an exam table. This table will slide in and out of the CT machine during the procedure.  Contrast dye will be injected into the IV. You might feel warm, or you may get a metallic taste in your mouth.  You will be given a medicine called   nitroglycerin. This will relax or dilate the arteries in your heart.  The table that you are lying on will move into the CT machine tunnel for the scan.  The person running the machine will give you instructions while the scans are being done. You may be asked to: ? Keep your arms above your head. ? Hold your breath. ? Stay very still, even if the table is moving.  When the scanning is complete, you  will be moved out of the machine.  The IV will be removed. The procedure may vary among health care providers and hospitals. What can I expect after the procedure? After your procedure, it is common to have:  A metallic taste in your mouth from the contrast dye.  A feeling of warmth.  A headache from the nitroglycerin. Follow these instructions at home:  Take over-the-counter and prescription medicines only as told by your health care provider.  If you are told, drink enough fluid to keep your urine pale yellow. This will help to flush the contrast dye out of your body.  Most people can return to their normal activities right after the procedure. Ask your health care provider what activities are safe for you.  It is up to you to get the results of your procedure. Ask your health care provider, or the department that is doing the procedure, when your results will be ready.  Keep all follow-up visits as told by your health care provider. This is important. Contact a health care provider if:  You have any symptoms of allergy to the contrast dye. These include: ? Shortness of breath. ? Rash or hives. ? A racing heartbeat. Summary  A cardiac CT angiogram is a procedure to look at the heart and the area around the heart. It may be done to help find the cause of chest pains or other symptoms of heart disease.  During this procedure, a large X-ray machine, called a CT scanner, takes detailed pictures of the heart and the surrounding area after a contrast dye has been injected into blood vessels in the area.  Ask your health care provider about changing or stopping your regular medicines before the procedure. This is especially important if you are taking diabetes medicines, blood thinners, or medicines to treat erectile dysfunction.  If you are told, drink enough fluid to keep your urine pale yellow. This will help to flush the contrast dye out of your body. This information is not  intended to replace advice given to you by your health care provider. Make sure you discuss any questions you have with your health care provider. Document Revised: 07/07/2019 Document Reviewed: 07/07/2019 Elsevier Patient Education  2020 Else[employer] Testing With IV Contrast Material IV contrast material is a fluid that is used with some imaging tests. It is injected into your body through a vein. Contrast material is used when your health care providers need a detailed look at organs, tissues, or blood vessels that may not show up with the standard test. The material may be used when an X-ray, an MRI, a CT scan, or an ultrasound is done. IV contrast material may be used for imaging tests that check:  Muscles, skin, and fat.  Breasts.  Brain.  Digestive tract.  Heart.  Organs such as the liver, kidneys, lungs, bladder, and many others.  Arteries and veins. Tell a health care provider about:  Any allergies you have, especially an allergy to contrast material.  All medicines you are taking, including   metformin, beta blockers, NSAIDs (such as ibuprofen), interleukin-2, vitamins, herbs, eye drops, creams, and over-the-counter medicines.  Any problems you or family members have had with the use of contrast material.  Any blood disorders you have, such as sickle cell anemia.  Any surgeries you have had.  Any medical conditions you have or have had, especially alcohol abuse, dehydration, asthma, or kidney, liver, or heart problems.  Whether you are pregnant or may be pregnant.  Whether you are breastfeeding. Most contrast materials are safe for use in breastfeeding women. What are the risks? Generally, this is a safe procedure. However, problems may occur, including:  Headache.  Itching, skin rash, and hives.  Nausea and vomiting.  Allergic reactions.  Wheezing or difficulty breathing.  Abnormal heart rate.  Changes in blood pressure.  Throat swelling.  Kidney  damage. What happens before the procedure? Medicines Ask your health care provider about:  Changing or stopping your regular medicines. This is especially important if you are taking diabetes medicines or blood thinners.  Taking medicines such as aspirin and ibuprofen. These medicines can thin your blood. Do not take these medicines unless your health care provider tells you to take them.  Taking over-the-counter medicines, vitamins, herbs, and supplements. If you are at risk of having a reaction to the IV contrast material, you may be asked to take medicine before the procedure to prevent a reaction. General instructions  Follow instructions from your health care provider about eating or drinking restrictions.  You may have an exam or lab tests to make sure that you can safely get IV contrast material.  Ask if you will be given a medicine to help you relax (sedative) during the procedure. If so, plan to have someone take you home from the hospital or clinic. What happens during the procedure?  You may be given a sedative to help you relax.  An IV will be inserted into one of your veins.  Contrast material will be injected into your IV.  You may feel warmth or flushing as the contrast material enters your bloodstream.  You may have a metallic taste in your mouth for a few minutes.  The needle may cause some discomfort and bruising.  After the contrast material is in your body, the imaging test will be done. The procedure may vary among health care providers and hospitals. What can I expect after the procedure?  The IV will be removed.  You may be taken to a recovery area if sedation medicines were used. Your blood pressure, heart rate, breathing rate, and blood oxygen level will be monitored until you leave the hospital or clinic. Follow these instructions at home:   Take over-the-counter and prescription medicines only as told by your health care provider. ? Your health  care provider may tell you to not take certain medicines for a couple of days after the procedure. This is especially important if you are taking diabetes medicines.  If you are told, drink enough fluid to keep your urine pale yellow. This will help to remove the contrast material out of your body.  Do not drive for 24 hours if you were given a sedative during your procedure.  It is up to you to get the results of your procedure. Ask your health care provider, or the department that is doing the procedure, when your results will be ready.  Keep all follow-up visits as told by your health care provider. This is important. Contact a health care provider if:    You have redness, swelling, or pain near your IV site. Get help right away if:  You have an abnormal heart rhythm.  You have trouble breathing.  You have: ? Chest pain. ? Pain in your back, neck, arm, jaw, or stomach. ? Nausea or sweating. ? Hives or a rash.  You start shaking and cannot stop. These symptoms may represent a serious problem that is an emergency. Do not wait to see if the symptoms will go away. Get medical help right away. Call your local emergency services (911 in the [place] Do not drive yourself to the hospital. Summary  IV contrast material may be used for imaging tests to help your health care providers see your organs and tissues more clearly.  Tell your health care provider if you are pregnant or may be pregnant.  During the procedure, you may feel warmth or flushing as the contrast material enters your bloodstream.  After the procedure, drink enough fluid to keep your urine pale yellow. This information is not intended to replace advice given to you by your health care provider. Make sure you discuss any questions you have with your health care provider. Document Revised: 01/28/2019 Document Reviewed: 01/28/2019 Elsevier Patient Education  2020 Elsevier Inc.  

## 2020-02-16 NOTE — Progress Notes (Signed)
Pt tolerated exam without incident.  PIV removed and dressing applied.  Pt provided with snack and beverage post exam.  Discharge instructions discussed, all questions answered.  Pt discharged

## 2020-02-17 ENCOUNTER — Telehealth: Payer: Self-pay

## 2020-02-17 NOTE — Telephone Encounter (Signed)
   [facility] Pre-operative Risk Assessment    Request for surgical clearance:  1. What type of surgery is being performed? Infected / impacted wisdom tooth  2. When is this surgery scheduled? TBD   3. What type of clearance is required (medical clearance vs. Pharmacy clearance to hold med vs. Both)? medical  4. Are there any medications that need to be held prior to surgery and how long? n/a   5. Practice name and name of physician performing surgery? [facility] / D. Roney Jaffe, DDS   6. What is your office phone number     7.   What is your office fax number [phone]  8.   Anesthesia type (None, local, MAC, general) ? IV sedation   Sherrie Mustache 02/17/2020, 4:05 PM  _________________________________________________________________   (provider comments below)

## 2020-02-17 NOTE — Telephone Encounter (Signed)
   Primary Cardiologist: [name], MD  Chart reviewed as part of pre-operative protocol coverage.  Sherri Atkins was seen on 01/10/2020 by Dr. Bjorn Pippin for evaluation of atypical chest pain with risk factors of age and hypertension.  She had an echocardiogram on 01/10/20 that showed normal EF of 60-65% and grade II DD. No significant valvular abnormalities. She also underwent coronary CTA that showed only minimal nonobstructive coronary disease.   Therefore, based on ACC/AHA guidelines, the patient would be at acceptable risk for the planned procedure without further cardiovascular testing.   I will route this recommendation to the requesting party via Epic fax function and remove from pre-op pool.  Please call with questions.  Berton Bon, NP 02/17/2020, 5:33 PM

## 2020-02-23 DIAGNOSIS — Z1159 Encounter for screening for other viral diseases: Secondary | ICD-10-CM | POA: Diagnosis not present

## 2020-02-28 DIAGNOSIS — Z1211 Encounter for screening for malignant neoplasm of colon: Secondary | ICD-10-CM | POA: Diagnosis not present

## 2020-02-28 DIAGNOSIS — K5730 Diverticulosis of large intestine without perforation or abscess without bleeding: Secondary | ICD-10-CM | POA: Diagnosis not present

## 2020-04-07 DIAGNOSIS — M19049 Primary osteoarthritis, unspecified hand: Secondary | ICD-10-CM | POA: Diagnosis not present

## 2020-04-07 DIAGNOSIS — M19041 Primary osteoarthritis, right hand: Secondary | ICD-10-CM | POA: Diagnosis not present

## 2020-04-07 DIAGNOSIS — I10 Essential (primary) hypertension: Secondary | ICD-10-CM | POA: Diagnosis not present

## 2020-04-07 DIAGNOSIS — F339 Major depressive disorder, recurrent, unspecified: Secondary | ICD-10-CM | POA: Diagnosis not present

## 2020-04-07 DIAGNOSIS — M19042 Primary osteoarthritis, left hand: Secondary | ICD-10-CM | POA: Diagnosis not present

## 2020-04-10 ENCOUNTER — Ambulatory Visit: Payer: Medicare HMO | Admitting: Cardiology

## 2020-04-14 ENCOUNTER — Other Ambulatory Visit: Payer: Self-pay

## 2020-04-14 ENCOUNTER — Encounter: Payer: Self-pay | Admitting: Cardiology

## 2020-04-14 ENCOUNTER — Ambulatory Visit (INDEPENDENT_AMBULATORY_CARE_PROVIDER_SITE_OTHER): Payer: Medicare HMO | Admitting: Cardiology

## 2020-04-14 VITALS — BP 128/82 | HR 57 | Temp 97.2°F | Ht 62.5 in | Wt 133.6 lb

## 2020-04-14 DIAGNOSIS — R918 Other nonspecific abnormal finding of lung field: Secondary | ICD-10-CM

## 2020-04-14 DIAGNOSIS — R079 Chest pain, unspecified: Secondary | ICD-10-CM | POA: Diagnosis not present

## 2020-04-14 DIAGNOSIS — I10 Essential (primary) hypertension: Secondary | ICD-10-CM | POA: Diagnosis not present

## 2020-04-14 NOTE — Progress Notes (Signed)
Cardiology Office Note:    Date:  04/16/2020   ID:  SWAYZEE WADLEY, DOB September 23, 1951, MRN [id number]  PCP:  Laurann Montana, MD  Cardiologist:  [name], MD  Electrophysiologist:  None   Referring MD: Merri Brunette, MD   Chief Complaint  Patient presents with  . Chest Pain    History of Present Illness:    Sherri Atkins is a 69 y.o. female with a hx of hypertension, anxiety, depression who presents for follow-up.  She was referred by Dr. Cliffton Asters for evaluation of chest pain, initially seen on 01/10/2020.  She reports that chest pain started in the prior few months.  Describes pressure in center of her chest, occurs when she is stressed.  Also feels short of breath.  Has been occurring a couple times per week.  In addition she has a second type of chest pain that she describes as occasional dull aching pain.  For exercise, she does gardening and will walk for 20 to 40 minutes about twice per week.  She denies any exertional symptoms.  Prescribed omeprazole for GERD, but has not started yet.  Reports BP under good control.  LDL 115 on 12/20/19.  Never smoked.  No family history heart disease.   TTE on 01/10/2020 showed LVEF 60 to 65%, grade 2 diastolic dysfunction, normal RV function, no significant valvular disease.  Cardiac CTA on 02/16/2020 showed calcium score 0.4 (47th percentile), minimal nonobstructive CAD  Also noted to have small lung nodules measuring 4 mm or less.  Since last clinic visit, she reports improvement in chest pain.  Continues to have pain when under significant stress and feels short of breath.  For exercise, she gardens.  Walks once every few weeks.  No chest pain/dyspnea with walking.    Past Medical History:  Diagnosis Date  . Anxiety   . Bee sting allergy   . Depression   . High blood pressure   . Migraines     Past Surgical History:  Procedure Laterality Date  . FOOT SURGERY  1999    Current Medications: Current Meds  Medication Sig  .  amLODipine (NORVASC) 5 MG tablet Take 5 mg by mouth daily.  Marland Kitchen CALCIUM-VITAMINS C & D PO Take 1 tablet by mouth daily.  Marland Kitchen EPINEPHrine (EPIPEN 2-PAK) 0.3 mg/0.3 mL IJ SOAJ injection Inject 0.3 mg into the muscle once.  . hydrochlorothiazide (HYDRODIURIL) 12.5 MG tablet   . Magnesium Hydroxide (MAGNESIA PO) Take 2 tablets by mouth daily.  . metoprolol tartrate (LOPRESSOR) 50 MG tablet Take 50 mg (1 tablet) TWO hours prior to CT  . Multiple Vitamins-Minerals (ZINC PO) Take 1 tablet by mouth daily.  . naproxen (NAPROSYN) 250 MG tablet Take by mouth 2 (two) times daily with a meal.  . nitroGLYCERIN (NITROSTAT) 0.4 MG SL tablet   . Omega-3 Fatty Acids (FISH OIL PO) Take 1 capsule by mouth daily.  Marland Kitchen omeprazole (PRILOSEC) 40 MG capsule   . rizatriptan (MAXALT) 10 MG tablet Take 10 mg by mouth as needed for migraine. May repeat in 2 hours if needed  . sertraline (ZOLOFT) 100 MG tablet Take 150 mg by mouth daily.   . traZODone (DESYREL) 25 mg TABS tablet Take 25 mg by mouth at bedtime.  . Wheat Dextrin (BENEFIBER DRINK MIX PO) Take 1 Dose by mouth as needed.     Allergies:   Doxycycline, Lisinopril, Rexulti [brexpiprazole], Septra [sulfamethoxazole-trimethoprim], and Neosporin [neomycin-bacitracin zn-polymyx]   Social History   Socioeconomic History  .  Marital status: Legally Separated    Spouse name: Not on file  . Number of children: Not on file  . Years of education: Not on file  . Highest education level: Not on file  Occupational History  . Not on file  Tobacco Use  . Smoking status: Never Smoker  . Smokeless tobacco: Never Used  Substance and Sexual Activity  . Alcohol use: No  . Drug use: No  . Sexual activity: Not on file  Other Topics Concern  . Not on file  Social History Narrative  . Not on file   Social Determinants of Health   Financial Resource Strain:   . Difficulty of Paying Living Expenses:   Food Insecurity:   . Worried About Charity fundraiser in the Last Year:    . Arboriculturist in the Last Year:   Transportation Needs:   . Film/video editor (Medical):   Marland Kitchen Lack of Transportation (Non-Medical):   Physical Activity:   . Days of Exercise per Week:   . Minutes of Exercise per Session:   Stress:   . Feeling of Stress :   Social Connections:   . Frequency of Communication with Friends and Family:   . Frequency of Social Gatherings with Friends and Family:   . Attends Religious Services:   . Active Member of Clubs or Organizations:   . Attends Archivist Meetings:   Marland Kitchen Marital Status:      Family History: The patient's family history includes Asthma in her brother; Constipation in her mother; Dementia in her mother; Depression in her mother, sister, and sister; Esophageal cancer in her brother; Hernia in her mother; Lung cancer in her father.  ROS:   Please see the history of present illness.     All other systems reviewed and are negative.  EKGs/Labs/Other Studies Reviewed:    The following studies were reviewed today:   EKG:  EKG is not ordered today.  The ekg ordered most recently demonstrates normal sinus rhythm, rate 65, no ST/T abnormalities  Recent Labs: 02/08/2020: BUN 19; Creatinine, Ser 0.89; Potassium 5.1; Sodium 141  Recent Lipid Panel No results found for: CHOL, TRIG, HDL, CHOLHDL, VLDL, LDLCALC, LDLDIRECT  Physical Exam:    VS:  BP 128/82   Pulse (!) 57   Temp (!) 97.2 F (36.2 C)   Ht 5' 2.5" (1.588 m)   Wt 133 lb 9.6 oz (60.6 kg)   SpO2 99%   BMI 24.05 kg/m     Wt Readings from Last 3 Encounters:  04/14/20 133 lb 9.6 oz (60.6 kg)  01/10/20 135 lb 9.6 oz (61.5 kg)  02/05/18 132 lb 12.8 oz (60.2 kg)     GEN:  Well nourished, well developed in no acute distress HEENT: Normal NECK: No JVD; No carotid bruits CARDIAC: RRR, no murmurs, rubs, gallops RESPIRATORY:  Clear to auscultation without rales, wheezing or rhonchi  ABDOMEN: Soft, non-tender, non-distended MUSCULOSKELETAL:  No edema; No  deformity  SKIN: Warm and dry NEUROLOGIC:  Alert and oriented x 3 PSYCHIATRIC:  Normal affect   ASSESSMENT:    1. Chest pain, unspecified type   2. Essential hypertension   3. Pulmonary nodules    PLAN:     Chest pain: Atypical in description.  TTE on 01/10/2020 showed LVEF 60 to 19%, grade 2 diastolic dysfunction, normal RV function, no significant valvular disease.  Cardiac CTA on 02/16/2020 showed calcium score 0.4 (47th percentile), minimal nonobstructive CAD.  No further cardiac  work-up recommended  Hypertension: On amlodipine 5 mg daily and hydrochlorothiazide 12.5 mg daily.  Appears well controlled  Pulmonary nodules: Scattered subpleural nodules measuring 4 mm or less on coronary CTA on 02/16/2020.  Can consider repeat CT chest at 12 months, though has patient is low risk (no smoking history, no prior history of malignancy), no follow-up is needed  RTC in 3 months.  Medication Adjustments/Labs and Tests Ordered: Current medicines are reviewed at length with the patient today.  Concerns regarding medicines are outlined above.  No orders of the defined types were placed in this encounter.  No orders of the defined types were placed in this encounter.   Patient Instructions  Medication Instructions:  Your physician recommends that you continue on your current medications as directed. Please refer to the Current Medication list given to you today.  Follow-Up: At [facility], you and your health needs are our priority.  As part of our continuing mission to provide you with exceptional heart care, we have created designated Provider Care Teams.  These Care Teams include your primary Cardiologist (physician) and Advanced Practice Providers (APPs -  Physician Assistants and Nurse Practitioners) who all work together to provide you with the care you need, when you need it.  We recommend signing up for the patient portal called "MyChart".  Sign up information is provided on this  After Visit Summary.  MyChart is used to connect with patients for Virtual Visits (Telemedicine).  Patients are able to view lab/test results, encounter notes, upcoming appointments, etc.  Non-urgent messages can be sent to your provider as well.   To learn more about what you can do with MyChart, go to [email or web address].    Your next appointment:   12 month(s)  The format for your next appointment:   In Person  Provider:   Epifanio Lesches, MD      Signed, [name], MD  04/16/2020 2:47 PM    [facility]

## 2020-04-14 NOTE — Patient Instructions (Signed)

## 2020-05-08 DIAGNOSIS — H5203 Hypermetropia, bilateral: Secondary | ICD-10-CM | POA: Diagnosis not present

## 2020-05-12 DIAGNOSIS — M19041 Primary osteoarthritis, right hand: Secondary | ICD-10-CM | POA: Diagnosis not present

## 2020-05-12 DIAGNOSIS — M19042 Primary osteoarthritis, left hand: Secondary | ICD-10-CM | POA: Diagnosis not present

## 2020-05-12 DIAGNOSIS — M79644 Pain in right finger(s): Secondary | ICD-10-CM | POA: Diagnosis not present

## 2020-05-12 DIAGNOSIS — M180 Bilateral primary osteoarthritis of first carpometacarpal joints: Secondary | ICD-10-CM | POA: Insufficient documentation

## 2020-05-12 DIAGNOSIS — M79645 Pain in left finger(s): Secondary | ICD-10-CM | POA: Diagnosis not present

## 2020-05-12 DIAGNOSIS — M79641 Pain in right hand: Secondary | ICD-10-CM | POA: Insufficient documentation

## 2020-05-12 DIAGNOSIS — M79642 Pain in left hand: Secondary | ICD-10-CM | POA: Diagnosis not present

## 2020-06-01 DIAGNOSIS — F332 Major depressive disorder, recurrent severe without psychotic features: Secondary | ICD-10-CM | POA: Diagnosis not present

## 2020-06-01 DIAGNOSIS — K219 Gastro-esophageal reflux disease without esophagitis: Secondary | ICD-10-CM | POA: Diagnosis not present

## 2020-06-01 DIAGNOSIS — R079 Chest pain, unspecified: Secondary | ICD-10-CM | POA: Diagnosis not present

## 2020-06-01 DIAGNOSIS — I10 Essential (primary) hypertension: Secondary | ICD-10-CM | POA: Diagnosis not present

## 2020-06-01 DIAGNOSIS — L989 Disorder of the skin and subcutaneous tissue, unspecified: Secondary | ICD-10-CM | POA: Diagnosis not present

## 2020-06-01 DIAGNOSIS — F411 Generalized anxiety disorder: Secondary | ICD-10-CM | POA: Diagnosis not present

## 2020-06-01 DIAGNOSIS — Z630 Problems in relationship with spouse or partner: Secondary | ICD-10-CM | POA: Diagnosis not present

## 2020-06-12 DIAGNOSIS — Z0100 Encounter for examination of eyes and vision without abnormal findings: Secondary | ICD-10-CM | POA: Diagnosis not present

## 2020-06-20 DIAGNOSIS — M79645 Pain in left finger(s): Secondary | ICD-10-CM | POA: Diagnosis not present

## 2020-06-20 DIAGNOSIS — M19042 Primary osteoarthritis, left hand: Secondary | ICD-10-CM | POA: Diagnosis not present

## 2020-06-20 DIAGNOSIS — M79641 Pain in right hand: Secondary | ICD-10-CM | POA: Diagnosis not present

## 2020-06-20 DIAGNOSIS — M180 Bilateral primary osteoarthritis of first carpometacarpal joints: Secondary | ICD-10-CM | POA: Diagnosis not present

## 2020-06-20 DIAGNOSIS — M19041 Primary osteoarthritis, right hand: Secondary | ICD-10-CM | POA: Diagnosis not present

## 2020-06-20 DIAGNOSIS — M79642 Pain in left hand: Secondary | ICD-10-CM | POA: Diagnosis not present

## 2020-06-20 DIAGNOSIS — M79644 Pain in right finger(s): Secondary | ICD-10-CM | POA: Diagnosis not present

## 2020-06-26 DIAGNOSIS — M180 Bilateral primary osteoarthritis of first carpometacarpal joints: Secondary | ICD-10-CM | POA: Diagnosis not present

## 2020-06-26 DIAGNOSIS — M19042 Primary osteoarthritis, left hand: Secondary | ICD-10-CM | POA: Diagnosis not present

## 2020-06-26 DIAGNOSIS — M19041 Primary osteoarthritis, right hand: Secondary | ICD-10-CM | POA: Diagnosis not present

## 2020-07-18 DIAGNOSIS — M79644 Pain in right finger(s): Secondary | ICD-10-CM | POA: Diagnosis not present

## 2020-07-18 DIAGNOSIS — M79645 Pain in left finger(s): Secondary | ICD-10-CM | POA: Diagnosis not present

## 2020-07-18 DIAGNOSIS — M180 Bilateral primary osteoarthritis of first carpometacarpal joints: Secondary | ICD-10-CM | POA: Diagnosis not present

## 2020-07-23 DIAGNOSIS — Z20828 Contact with and (suspected) exposure to other viral communicable diseases: Secondary | ICD-10-CM | POA: Diagnosis not present

## 2020-11-09 DIAGNOSIS — F339 Major depressive disorder, recurrent, unspecified: Secondary | ICD-10-CM | POA: Diagnosis not present

## 2020-11-09 DIAGNOSIS — M19042 Primary osteoarthritis, left hand: Secondary | ICD-10-CM | POA: Diagnosis not present

## 2020-11-09 DIAGNOSIS — I10 Essential (primary) hypertension: Secondary | ICD-10-CM | POA: Diagnosis not present

## 2020-11-09 DIAGNOSIS — G4700 Insomnia, unspecified: Secondary | ICD-10-CM | POA: Diagnosis not present

## 2020-11-09 DIAGNOSIS — K219 Gastro-esophageal reflux disease without esophagitis: Secondary | ICD-10-CM | POA: Diagnosis not present

## 2020-11-09 DIAGNOSIS — M19041 Primary osteoarthritis, right hand: Secondary | ICD-10-CM | POA: Diagnosis not present

## 2020-11-09 DIAGNOSIS — M19049 Primary osteoarthritis, unspecified hand: Secondary | ICD-10-CM | POA: Diagnosis not present

## 2020-11-20 DIAGNOSIS — M180 Bilateral primary osteoarthritis of first carpometacarpal joints: Secondary | ICD-10-CM | POA: Diagnosis not present

## 2020-11-20 DIAGNOSIS — M19042 Primary osteoarthritis, left hand: Secondary | ICD-10-CM | POA: Diagnosis not present

## 2020-11-20 DIAGNOSIS — M19041 Primary osteoarthritis, right hand: Secondary | ICD-10-CM | POA: Diagnosis not present

## 2020-12-05 DIAGNOSIS — M19042 Primary osteoarthritis, left hand: Secondary | ICD-10-CM | POA: Diagnosis not present

## 2020-12-05 DIAGNOSIS — M180 Bilateral primary osteoarthritis of first carpometacarpal joints: Secondary | ICD-10-CM | POA: Diagnosis not present

## 2020-12-05 DIAGNOSIS — M25641 Stiffness of right hand, not elsewhere classified: Secondary | ICD-10-CM | POA: Diagnosis not present

## 2020-12-05 DIAGNOSIS — M19041 Primary osteoarthritis, right hand: Secondary | ICD-10-CM | POA: Diagnosis not present

## 2021-01-03 DIAGNOSIS — Z1159 Encounter for screening for other viral diseases: Secondary | ICD-10-CM | POA: Diagnosis not present

## 2021-02-05 DIAGNOSIS — I10 Essential (primary) hypertension: Secondary | ICD-10-CM | POA: Diagnosis not present

## 2021-02-05 DIAGNOSIS — M19049 Primary osteoarthritis, unspecified hand: Secondary | ICD-10-CM | POA: Diagnosis not present

## 2021-02-05 DIAGNOSIS — M19042 Primary osteoarthritis, left hand: Secondary | ICD-10-CM | POA: Diagnosis not present

## 2021-02-05 DIAGNOSIS — K219 Gastro-esophageal reflux disease without esophagitis: Secondary | ICD-10-CM | POA: Diagnosis not present

## 2021-02-05 DIAGNOSIS — F339 Major depressive disorder, recurrent, unspecified: Secondary | ICD-10-CM | POA: Diagnosis not present

## 2021-02-05 DIAGNOSIS — M19041 Primary osteoarthritis, right hand: Secondary | ICD-10-CM | POA: Diagnosis not present

## 2021-02-05 DIAGNOSIS — G4700 Insomnia, unspecified: Secondary | ICD-10-CM | POA: Diagnosis not present

## 2021-02-08 DIAGNOSIS — Z630 Problems in relationship with spouse or partner: Secondary | ICD-10-CM | POA: Diagnosis not present

## 2021-02-08 DIAGNOSIS — F332 Major depressive disorder, recurrent severe without psychotic features: Secondary | ICD-10-CM | POA: Diagnosis not present

## 2021-02-08 DIAGNOSIS — F411 Generalized anxiety disorder: Secondary | ICD-10-CM | POA: Diagnosis not present

## 2021-04-23 NOTE — Progress Notes (Deleted)
Cardiology Office Note:    Date:  04/23/2021   ID:  Sherri Atkins, DOB 1951/06/29, MRN [id number]  PCP:  Sherri Montana, MD  Cardiologist:  Sherri Ishikawa, MD  Electrophysiologist:  None   Referring MD: Sherri Montana, MD   No chief complaint on file.   History of Present Illness:    Sherri Atkins is a 70 y.o. female with a hx of hypertension, anxiety, depression who presents for follow-up.  She was referred by Dr. Cliffton Asters for evaluation of chest pain, initially seen on 01/10/2020.  She reports that chest pain started in the prior few months.  Describes pressure in center of her chest, occurs when she is stressed.  Also feels short of breath.  Has been occurring a couple times per week.  In addition she has a second type of chest pain that she describes as occasional dull aching pain.  For exercise, she does gardening and will walk for 20 to 40 minutes about twice per week.  She denies any exertional symptoms.  Prescribed omeprazole for GERD, but has not started yet.  Reports BP under good control.  LDL 115 on 12/20/19.  Never smoked.  No family history heart disease.   TTE on 01/10/2020 showed LVEF 60 to 65%, grade 2 diastolic dysfunction, normal RV function, no significant valvular disease.  Cardiac CTA on 02/16/2020 showed calcium score 0.4 (47th percentile), minimal nonobstructive CAD  Also noted to have small lung nodules measuring 4 mm or less.  Since last clinic visit,   she reports improvement in chest pain.  Continues to have pain when under significant stress and feels short of breath.  For exercise, she gardens.  Walks once every few weeks.  No chest pain/dyspnea with walking.    Past Medical History:  Diagnosis Date  . Anxiety   . Bee sting allergy   . Depression   . High blood pressure   . Migraines     Past Surgical History:  Procedure Laterality Date  . FOOT SURGERY  1999    Current Medications: No outpatient medications have been marked as taking for  the 04/25/21 encounter (Appointment) with Sherri Ishikawa, MD.     Allergies:   Doxycycline, Lisinopril, Rexulti [brexpiprazole], Septra [sulfamethoxazole-trimethoprim], and Neosporin [neomycin-bacitracin zn-polymyx]   Social History   Socioeconomic History  . Marital status: Legally Separated    Spouse name: Not on file  . Number of children: Not on file  . Years of education: Not on file  . Highest education level: Not on file  Occupational History  . Not on file  Tobacco Use  . Smoking status: Never Smoker  . Smokeless tobacco: Never Used  Substance and Sexual Activity  . Alcohol use: No  . Drug use: No  . Sexual activity: Not on file  Other Topics Concern  . Not on file  Social History Narrative  . Not on file   Social Determinants of Health   Financial Resource Strain: Not on file  Food Insecurity: Not on file  Transportation Needs: Not on file  Physical Activity: Not on file  Stress: Not on file  Social Connections: Not on file     Family History: The patient's family history includes Asthma in her brother; Constipation in her mother; Dementia in her mother; Depression in her mother, sister, and sister; Esophageal cancer in her brother; Hernia in her mother; Lung cancer in her father.  ROS:   Please see the history of present illness.  All other systems reviewed and are negative.  EKGs/Labs/Other Studies Reviewed:    The following studies were reviewed today:   EKG:  EKG is not ordered today.  The ekg ordered most recently demonstrates normal sinus rhythm, rate 65, no ST/T abnormalities  Recent Labs: No results found for requested labs within last 8760 hours.  Recent Lipid Panel No results found for: CHOL, TRIG, HDL, CHOLHDL, VLDL, LDLCALC, LDLDIRECT  Physical Exam:    VS:  There were no vitals taken for this visit.    Wt Readings from Last 3 Encounters:  04/14/20 133 lb 9.6 oz (60.6 kg)  01/10/20 135 lb 9.6 oz (61.5 kg)  02/05/18 132 lb  12.8 oz (60.2 kg)     GEN:  Well nourished, well developed in no acute distress HEENT: Normal NECK: No JVD; No carotid bruits CARDIAC: RRR, no murmurs, rubs, gallops RESPIRATORY:  Clear to auscultation without rales, wheezing or rhonchi  ABDOMEN: Soft, non-tender, non-distended MUSCULOSKELETAL:  No edema; No deformity  SKIN: Warm and dry NEUROLOGIC:  Alert and oriented x 3 PSYCHIATRIC:  Normal affect   ASSESSMENT:    No diagnosis found. PLAN:     Palpitations: Description concerning for arrhythmia, will evaluate with Zio patch x2 weeks.  Will check electrolytes and TSH  Chest pain: Atypical in description.  TTE on 01/10/2020 showed LVEF 60 to 65%, grade 2 diastolic dysfunction, normal RV function, no significant valvular disease.  Cardiac CTA on 02/16/2020 showed calcium score 0.4 (47th percentile), minimal nonobstructive CAD.  No further cardiac work-up recommended  Hypertension: On amlodipine 5 mg daily and hydrochlorothiazide 12.5 mg daily.  Appears well controlled.  Will check BMP, magnesium  Pulmonary nodules: Scattered subpleural nodules measuring 4 mm or less on coronary CTA on 02/16/2020.  Can consider repeat CT chest at 12 months, though has patient is low risk (no smoking history, no prior history of malignancy), no follow-up is needed  RTC in 1 year  Medication Adjustments/Labs and Tests Ordered: Current medicines are reviewed at length with the patient today.  Concerns regarding medicines are outlined above.  No orders of the defined types were placed in this encounter.  No orders of the defined types were placed in this encounter.   There are no Patient Instructions on file for this visit.   Signed, Sherri Ishikawa, MD  04/23/2021 11:10 AM    [facility]

## 2021-04-25 ENCOUNTER — Ambulatory Visit (INDEPENDENT_AMBULATORY_CARE_PROVIDER_SITE_OTHER): Payer: Medicare HMO

## 2021-04-25 ENCOUNTER — Encounter: Payer: Self-pay | Admitting: Cardiology

## 2021-04-25 ENCOUNTER — Other Ambulatory Visit: Payer: Self-pay

## 2021-04-25 ENCOUNTER — Ambulatory Visit (INDEPENDENT_AMBULATORY_CARE_PROVIDER_SITE_OTHER): Payer: Medicare HMO | Admitting: Cardiology

## 2021-04-25 VITALS — BP 122/76 | HR 61 | Ht 62.5 in | Wt 131.0 lb

## 2021-04-25 DIAGNOSIS — R079 Chest pain, unspecified: Secondary | ICD-10-CM

## 2021-04-25 DIAGNOSIS — E785 Hyperlipidemia, unspecified: Secondary | ICD-10-CM | POA: Diagnosis not present

## 2021-04-25 DIAGNOSIS — R002 Palpitations: Secondary | ICD-10-CM | POA: Diagnosis not present

## 2021-04-25 DIAGNOSIS — R918 Other nonspecific abnormal finding of lung field: Secondary | ICD-10-CM

## 2021-04-25 DIAGNOSIS — I10 Essential (primary) hypertension: Secondary | ICD-10-CM | POA: Diagnosis not present

## 2021-04-25 LAB — CBC
Hematocrit: 38.7 % (ref 34.0–46.6)
Hemoglobin: 13.2 g/dL (ref 11.1–15.9)
MCH: 29.5 pg (ref 26.6–33.0)
MCHC: 34.1 g/dL (ref 31.5–35.7)
MCV: 86 fL (ref 79–97)
Platelets: 178 10*3/uL (ref 150–450)
RBC: 4.48 x10E6/uL (ref 3.77–5.28)
RDW: 12.4 % (ref 11.7–15.4)
WBC: 5.5 10*3/uL (ref 3.4–10.8)

## 2021-04-25 LAB — BASIC METABOLIC PANEL
BUN/Creatinine Ratio: 16 (ref 12–28)
BUN: 15 mg/dL (ref 8–27)
CO2: 25 mmol/L (ref 20–29)
Calcium: 9.6 mg/dL (ref 8.7–10.3)
Chloride: 103 mmol/L (ref 96–106)
Creatinine, Ser: 0.94 mg/dL (ref 0.57–1.00)
Glucose: 79 mg/dL (ref 65–99)
Potassium: 3.8 mmol/L (ref 3.5–5.2)
Sodium: 142 mmol/L (ref 134–144)
eGFR: 66 mL/min/{1.73_m2} (ref >59)

## 2021-04-25 LAB — LIPID PANEL
Chol/HDL Ratio: 3.6 ratio (ref 0.0–4.4)
Cholesterol, Total: 207 mg/dL — ABNORMAL HIGH (ref 100–199)
HDL: 58 mg/dL (ref >39)
LDL Chol Calc (NIH): 124 mg/dL — ABNORMAL HIGH (ref 0–99)
Triglycerides: 142 mg/dL (ref 0–149)
VLDL Cholesterol Cal: 25 mg/dL (ref 5–40)

## 2021-04-25 LAB — TSH: TSH: 3.23 u[IU]/mL (ref 0.450–4.500)

## 2021-04-25 LAB — MAGNESIUM: Magnesium: 2.1 mg/dL (ref 1.6–2.3)

## 2021-04-25 NOTE — Progress Notes (Signed)
Cardiology Office Note:    Date:  04/25/2021   ID:  Sherri Atkins, DOB 1951-08-30, MRN [id number]  PCP:  Laurann Montana, MD  Cardiologist:  [name], MD  Electrophysiologist:  None   Referring MD: Laurann Montana, MD   Chief Complaint  Patient presents with  . Palpitations    History of Present Illness:    Sherri Atkins is a 70 y.o. female with a hx of hypertension, anxiety, depression who presents for follow-up.  She was referred by Dr. Cliffton Asters for evaluation of chest pain, initially seen on 01/10/2020.  She reports that chest pain started in the prior few months.  Describes pressure in center of her chest, occurs when she is stressed.  Also feels short of breath.  Has been occurring a couple times per week.  In addition she has a second type of chest pain that she describes as occasional dull aching pain.  For exercise, she does gardening and will walk for 20 to 40 minutes about twice per week.  She denies any exertional symptoms.  Prescribed omeprazole for GERD, but has not started yet.  Reports BP under good control.  LDL 115 on 12/20/19.  Never smoked.  No family history heart disease.   TTE on 01/10/2020 showed LVEF 60 to 65%, grade 2 diastolic dysfunction, normal RV function, no significant valvular disease.  Cardiac CTA on 02/16/2020 showed calcium score 0.4 (47th percentile), minimal nonobstructive CAD  Also noted to have small lung nodules measuring 4 mm or less.  Today, she is doing well. She states that she does not experience chest pain as much as she has since her last clinic visit. She has been having palpitations, about twice in last month. When she does experience the palpitations it lasts for fifteen seconds. Associated with SOB. For exercise, she gardens for hours at a time. She denies any lightheadedness, syncope, or lower extremity edema.   Past Medical History:  Diagnosis Date  . Anxiety   . Bee sting allergy   . Depression   . High blood pressure   .  Migraines     Past Surgical History:  Procedure Laterality Date  . FOOT SURGERY  1999    Current Medications: Current Meds  Medication Sig  . amLODipine (NORVASC) 5 MG tablet Take 5 mg by mouth daily.  . calcium carbonate 100 mg/ml SUSP Take by mouth.  . EPINEPHrine 0.3 mg/0.3 mL IJ SOAJ injection Inject 0.3 mg into the muscle once.  . ergocalciferol (VITAMIN D2) 1.25 MG (50000 UT) capsule Take by mouth.  . meloxicam (MOBIC) 7.5 MG tablet TAKE 1 TABLET EVERY DAY WITH FOOD  . Multiple Vitamins-Minerals (ZINC PO) Take 1 tablet by mouth daily.  . naproxen (NAPROSYN) 250 MG tablet Take by mouth 2 (two) times daily with a meal.  . nitroGLYCERIN (NITROSTAT) 0.4 MG SL tablet   . Omega-3 Fatty Acids (FISH OIL PO) Take 1 capsule by mouth daily.  Marland Kitchen omeprazole (PRILOSEC) 40 MG capsule   . rizatriptan (MAXALT) 10 MG tablet Take 10 mg by mouth as needed for migraine. May repeat in 2 hours if needed  . sertraline (ZOLOFT) 100 MG tablet Take 150 mg by mouth daily.   . traZODone (DESYREL) 25 mg TABS tablet Take 25 mg by mouth at bedtime.     Allergies:   Other, Doxycycline, Lisinopril, Rexulti [brexpiprazole], Septra [sulfamethoxazole-trimethoprim], and Neosporin [neomycin-bacitracin zn-polymyx]   Social History   Socioeconomic History  . Marital status: Legally Separated  Spouse name: Not on file  . Number of children: Not on file  . Years of education: Not on file  . Highest education level: Not on file  Occupational History  . Not on file  Tobacco Use  . Smoking status: Never Smoker  . Smokeless tobacco: Never Used  Substance and Sexual Activity  . Alcohol use: No  . Drug use: No  . Sexual activity: Not on file  Other Topics Concern  . Not on file  Social History Narrative  . Not on file   Social Determinants of Health   Financial Resource Strain: Not on file  Food Insecurity: Not on file  Transportation Needs: Not on file  Physical Activity: Not on file  Stress: Not on  file  Social Connections: Not on file     Family History: The patient's family history includes Asthma in her brother; Constipation in her mother; Dementia in her mother; Depression in her mother, sister, and sister; Esophageal cancer in her brother; Hernia in her mother; Lung cancer in her father.  ROS:   Please see the history of present illness.     All other systems reviewed and are negative.  EKGs/Labs/Other Studies Reviewed:    The following studies were reviewed today:   EKG:   04/25/2021- The EKG ordered demonstrates sinus rhythm, rate 61, LVH, Q waves in lead 1 and lead 2   04/14/2020- The EKG ordered most recently demonstrates normal sinus rhythm, rate 65, no ST/T abnormalities     Recent Labs: No results found for requested labs within last 8760 hours.  Recent Lipid Panel No results found for: CHOL, TRIG, HDL, CHOLHDL, VLDL, LDLCALC, LDLDIRECT  Physical Exam:    VS:  BP 122/76   Pulse 61   Ht 5' 2.5" (1.588 m)   Wt 131 lb (59.4 kg)   SpO2 97%   BMI 23.58 kg/m     Wt Readings from Last 3 Encounters:  04/25/21 131 lb (59.4 kg)  04/14/20 133 lb 9.6 oz (60.6 kg)  01/10/20 135 lb 9.6 oz (61.5 kg)     GEN:  Well nourished, well developed in no acute distress HEENT: Normal NECK: No JVD; No carotid bruits CARDIAC: RRR, no murmurs, rubs, gallops RESPIRATORY:  Clear to auscultation without rales, wheezing or rhonchi  ABDOMEN: Soft, non-tender, non-distended MUSCULOSKELETAL:  No edema; No deformity  SKIN: Warm and dry NEUROLOGIC:  Alert and oriented x 3 PSYCHIATRIC:  Normal affect   ASSESSMENT:    1. Palpitations   2. Chest pain, unspecified type   3. Essential hypertension   4. Hyperlipidemia, unspecified hyperlipidemia type   5. Pulmonary nodules    PLAN:    Palpitations: Description concerning for arrhythmia, will evaluate with Zio patch x2 weeks.  Will check electrolytes and TSH  Chest pain: Atypical in description.  TTE on 01/10/2020 showed LVEF 60 to  65%, grade 2 diastolic dysfunction, normal RV function, no significant valvular disease.  Cardiac CTA on 02/16/2020 showed calcium score 0.4 (47th percentile), minimal nonobstructive CAD.  No further cardiac work-up recommended  Hypertension: On amlodipine 5 mg daily and hydrochlorothiazide 12.5 mg daily.  Appears well controlled.  Will check BMP, magnesium  Pulmonary nodules: Scattered subpleural nodules measuring 4 mm or less on coronary CTA on 02/16/2020.  Can consider repeat CT chest at 12 months, though has patient is low risk (no smoking history, no prior history of malignancy), no follow-up is needed  Hyperlipidemia: Will check lipid panel   RTC in 1 year  Medication Adjustments/Labs and Tests Ordered: Current medicines are reviewed at length with the patient today.  Concerns regarding medicines are outlined above.  Orders Placed This Encounter  Procedures  . Basic metabolic panel  . CBC  . Lipid panel  . Magnesium  . TSH  . LONG TERM MONITOR (3-14 DAYS)  . EKG 12-Lead   No orders of the defined types were placed in this encounter.   Patient Instructions  Medication Instructions:  Your physician recommends that you continue on your current medications as directed. Please refer to the Current Medication list given to you today.  *If you need a refill on your cardiac medications before your next appointment, please call your pharmacy*   Lab Work: BMET, CBC, Lipid, TSH, Mag today  If you have labs (blood work) drawn today and your tests are completely normal, you will receive your results only by: Marland Kitchen MyChart Message (if you have MyChart) OR . A paper copy in the mail If you have any lab test that is abnormal or we need to change your treatment, we will call you to review the results.   Testing/Procedures: Christena Deem- Long Term Monitor Instructions   Your physician has requested you wear a ZIO patch monitor for _14_ days.  This is a single patch monitor.   [employer] supplies  one patch monitor per enrollment. Additional stickers are not available. Please do not apply patch if you will be having a Nuclear Stress Test, Echocardiogram, Cardiac CT, MRI, or Chest Xray during the period you would be wearing the monitor. The patch cannot be worn during these tests. You cannot remove and re-apply the ZIO XT patch monitor.  Your ZIO patch monitor will be sent Fed Ex from [employer] directly to your home address. It may take 3-5 days to receive your monitor after you have been enrolled.  Once you have received your monitor, please review the enclosed instructions. Your monitor has already been registered assigning a specific monitor serial # to you.  Billing and Patient Assistance Program Information   We have supplied [employer] with any of your insurance information on file for billing purposes. [employer] offers a sliding scale Patient Assistance Program for patients that do not have insurance, or whose insurance does not completely cover the cost of the ZIO monitor.   You must apply for the Patient Assistance Program to qualify for this discounted rate.     To apply, please call [employer] at [phone], select option 4, then select option 2, and ask to apply for Patient Assistance Program.  Meredeth Ide will ask your household income, and how many people are in your household.  They will quote your out-of-pocket cost based on that information.  [employer] will also be able to set up a 61-month, interest-free payment plan if needed.  Applying the monitor   Shave hair from upper left chest.  Hold abrader disc by orange tab. Rub abrader in 40 strokes over the upper left chest as indicated in your monitor instructions.  Clean area with 4 enclosed alcohol pads. Let dry.  Apply patch as indicated in monitor instructions. Patch will be placed under collarbone on left side of chest with arrow pointing upward.  Rub patch adhesive wings for 2 minutes. Remove white label marked "1". Remove the  white label marked "2". Rub patch adhesive wings for 2 additional minutes.  While looking in a mirror, press and release button in center of patch. A small green light will flash 3-4 times. This will be  your only indicator that the monitor has been turned on. ?  Do not shower for the first 24 hours. You may shower after the first 24 hours.  Press the button if you feel a symptom. You will hear a small click. Record Date, Time and Symptom in the Patient Logbook.  When you are ready to remove the patch, follow instructions on the last 2 pages of the Patient Logbook. Stick patch monitor onto the last page of Patient Logbook.  Place Patient Logbook in the blue and white box.  Use locking tab on box and tape box closed securely.  The blue and white box has prepaid postage on it. Please place it in the mailbox as soon as possible. Your physician should have your test results approximately 7 days after the monitor has been mailed back to [facility].  Call [facility] Customer Care at [phone] if you have questions regarding your ZIO XT patch monitor. Call them immediately if you see an orange light blinking on your monitor.  If your monitor falls off in less than 4 days, contact our Monitor department at [phone]. ?If your monitor becomes loose or falls off after 4 days call [employer] at [phone] for suggestions on securing your monitor.?  Follow-Up: At [facility], you and your health needs are our priority.  As part of our continuing mission to provide you with exceptional heart care, we have created designated Provider Care Teams.  These Care Teams include your primary Cardiologist (physician) and Advanced Practice Providers (APPs -  Physician Assistants and Nurse Practitioners) who all work together to provide you with the care you need, when you need it.  We recommend signing up for the patient portal called "MyChart".  Sign up information is provided on this After Visit Summary.   MyChart is used to connect with patients for Virtual Visits (Telemedicine).  Patients are able to view lab/test results, encounter notes, upcoming appointments, etc.  Non-urgent messages can be sent to your provider as well.   To learn more about what you can do with MyChart, go to [email or web address].    Your next appointment:   12 month(s)  The format for your next appointment:   In Person  Provider:   Epifanio Lescheshristopher Burma Ketcher, MD         Caprice BeaverI,Rebekah Moorehead,acting as a scribe for Little Ishikawahristopher L Elion Hocker, MD.,have documented all relevant documentation on the behalf of Little Ishikawahristopher L Ghalia Reicks, MD,as directed by  Little Ishikawahristopher L Camia Dipinto, MD while in the presence of Little Ishikawahristopher L Avalynne Diver, MD.  I, Little Ishikawahristopher L Cecile Gillispie, MD, have reviewed all documentation for this visit. The documentation on 04/25/21 for the exam, diagnosis, procedures, and orders are all accurate and complete.   Signed, Little Ishikawahristopher L Barb Shear, MD  04/25/2021 9:32 AM    [facility]

## 2021-04-25 NOTE — Patient Instructions (Signed)
Medication Instructions:  Your physician recommends that you continue on your current medications as directed. Please refer to the Current Medication list given to you today.  *If you need a refill on your cardiac medications before your next appointment, please call your pharmacy*   Lab Work: BMET, CBC, Lipid, TSH, Mag today  If you have labs (blood work) drawn today and your tests are completely normal, you will receive your results only by: Marland Kitchen MyChart Message (if you have MyChart) OR . A paper copy in the mail If you have any lab test that is abnormal or we need to change your treatment, we will call you to review the results.   Testing/Procedures: Christena Deem- Long Term Monitor Instructions   Your physician has requested you wear a ZIO patch monitor for _14_ days.  This is a single patch monitor.   [employer] supplies one patch monitor per enrollment. Additional stickers are not available. Please do not apply patch if you will be having a Nuclear Stress Test, Echocardiogram, Cardiac CT, MRI, or Chest Xray during the period you would be wearing the monitor. The patch cannot be worn during these tests. You cannot remove and re-apply the ZIO XT patch monitor.  Your ZIO patch monitor will be sent Fed Ex from [employer] directly to your home address. It may take 3-5 days to receive your monitor after you have been enrolled.  Once you have received your monitor, please review the enclosed instructions. Your monitor has already been registered assigning a specific monitor serial # to you.  Billing and Patient Assistance Program Information   We have supplied [employer] with any of your insurance information on file for billing purposes. [employer] offers a sliding scale Patient Assistance Program for patients that do not have insurance, or whose insurance does not completely cover the cost of the ZIO monitor.   You must apply for the Patient Assistance Program to qualify for this discounted rate.      To apply, please call [employer] at [phone], select option 4, then select option 2, and ask to apply for Patient Assistance Program.  Meredeth Ide will ask your household income, and how many people are in your household.  They will quote your out-of-pocket cost based on that information.  [employer] will also be able to set up a 38-month, interest-free payment plan if needed.  Applying the monitor   Shave hair from upper left chest.  Hold abrader disc by orange tab. Rub abrader in 40 strokes over the upper left chest as indicated in your monitor instructions.  Clean area with 4 enclosed alcohol pads. Let dry.  Apply patch as indicated in monitor instructions. Patch will be placed under collarbone on left side of chest with arrow pointing upward.  Rub patch adhesive wings for 2 minutes. Remove white label marked "1". Remove the white label marked "2". Rub patch adhesive wings for 2 additional minutes.  While looking in a mirror, press and release button in center of patch. A small green light will flash 3-4 times. This will be your only indicator that the monitor has been turned on. ?  Do not shower for the first 24 hours. You may shower after the first 24 hours.  Press the button if you feel a symptom. You will hear a small click. Record Date, Time and Symptom in the Patient Logbook.  When you are ready to remove the patch, follow instructions on the last 2 pages of the Patient Logbook. Stick patch monitor onto the  last page of Patient Logbook.  Place Patient Logbook in the blue and white box.  Use locking tab on box and tape box closed securely.  The blue and white box has prepaid postage on it. Please place it in the mailbox as soon as possible. Your physician should have your test results approximately 7 days after the monitor has been mailed back to [facility]  Call [facility] Customer Care at [phone] if you have questions regarding your ZIO XT patch monitor. Call them immediately if  you see an orange light blinking on your monitor.  If your monitor falls off in less than 4 days, contact our Monitor department at [phone]. ?If your monitor becomes loose or falls off after 4 days call [employer] at [phone] for suggestions on securing your monitor.?  Follow-Up: At [facility], you and your health needs are our priority.  As part of our continuing mission to provide you with exceptional heart care, we have created designated Provider Care Teams.  These Care Teams include your primary Cardiologist (physician) and Advanced Practice Providers (APPs -  Physician Assistants and Nurse Practitioners) who all work together to provide you with the care you need, when you need it.  We recommend signing up for the patient portal called "MyChart".  Sign up information is provided on this After Visit Summary.  MyChart is used to connect with patients for Virtual Visits (Telemedicine).  Patients are able to view lab/test results, encounter notes, upcoming appointments, etc.  Non-urgent messages can be sent to your provider as well.   To learn more about what you can do with MyChart, go to [email or web address].    Your next appointment:   12 month(s)  The format for your next appointment:   In Person  Provider:   Epifanio Lesches, MD

## 2021-04-25 NOTE — Progress Notes (Unsigned)
Patient enrolled for [employer] to mail a 14 day ZIO XT to her home.

## 2021-04-29 DIAGNOSIS — R002 Palpitations: Secondary | ICD-10-CM

## 2021-05-16 DIAGNOSIS — Z91038 Other insect allergy status: Secondary | ICD-10-CM | POA: Diagnosis not present

## 2021-05-16 DIAGNOSIS — I10 Essential (primary) hypertension: Secondary | ICD-10-CM | POA: Diagnosis not present

## 2021-05-16 DIAGNOSIS — F419 Anxiety disorder, unspecified: Secondary | ICD-10-CM | POA: Diagnosis not present

## 2021-05-16 DIAGNOSIS — K219 Gastro-esophageal reflux disease without esophagitis: Secondary | ICD-10-CM | POA: Diagnosis not present

## 2021-05-18 DIAGNOSIS — R002 Palpitations: Secondary | ICD-10-CM | POA: Diagnosis not present

## 2021-05-23 DIAGNOSIS — M180 Bilateral primary osteoarthritis of first carpometacarpal joints: Secondary | ICD-10-CM | POA: Diagnosis not present

## 2021-05-23 DIAGNOSIS — R200 Anesthesia of skin: Secondary | ICD-10-CM | POA: Diagnosis not present

## 2021-05-25 ENCOUNTER — Other Ambulatory Visit: Payer: Self-pay | Admitting: *Deleted

## 2021-05-25 MED ORDER — ROSUVASTATIN CALCIUM 10 MG PO TABS: 10.0000 mg | ORAL_TABLET | Freq: Every day | ORAL | 3 refills | Status: DC

## 2021-06-20 ENCOUNTER — Encounter: Payer: Self-pay | Admitting: *Deleted

## 2021-07-09 DIAGNOSIS — F411 Generalized anxiety disorder: Secondary | ICD-10-CM | POA: Diagnosis not present

## 2021-07-09 DIAGNOSIS — F332 Major depressive disorder, recurrent severe without psychotic features: Secondary | ICD-10-CM | POA: Diagnosis not present

## 2021-07-09 DIAGNOSIS — Z630 Problems in relationship with spouse or partner: Secondary | ICD-10-CM | POA: Diagnosis not present

## 2021-07-23 ENCOUNTER — Other Ambulatory Visit: Payer: Self-pay

## 2021-07-23 MED ORDER — ROSUVASTATIN CALCIUM 10 MG PO TABS: 10.0000 mg | ORAL_TABLET | Freq: Every day | ORAL | 3 refills | Status: AC

## 2021-08-15 DIAGNOSIS — G5603 Carpal tunnel syndrome, bilateral upper limbs: Secondary | ICD-10-CM | POA: Diagnosis not present

## 2021-08-31 DIAGNOSIS — M180 Bilateral primary osteoarthritis of first carpometacarpal joints: Secondary | ICD-10-CM | POA: Diagnosis not present

## 2021-08-31 DIAGNOSIS — G5603 Carpal tunnel syndrome, bilateral upper limbs: Secondary | ICD-10-CM | POA: Diagnosis not present

## 2021-09-12 DIAGNOSIS — E785 Hyperlipidemia, unspecified: Secondary | ICD-10-CM | POA: Diagnosis not present

## 2021-09-12 DIAGNOSIS — G4700 Insomnia, unspecified: Secondary | ICD-10-CM | POA: Diagnosis not present

## 2021-09-12 DIAGNOSIS — K219 Gastro-esophageal reflux disease without esophagitis: Secondary | ICD-10-CM | POA: Diagnosis not present

## 2021-09-12 DIAGNOSIS — F339 Major depressive disorder, recurrent, unspecified: Secondary | ICD-10-CM | POA: Diagnosis not present

## 2021-09-12 DIAGNOSIS — M19042 Primary osteoarthritis, left hand: Secondary | ICD-10-CM | POA: Diagnosis not present

## 2021-09-12 DIAGNOSIS — M19049 Primary osteoarthritis, unspecified hand: Secondary | ICD-10-CM | POA: Diagnosis not present

## 2021-09-12 DIAGNOSIS — M19041 Primary osteoarthritis, right hand: Secondary | ICD-10-CM | POA: Diagnosis not present

## 2021-09-12 DIAGNOSIS — I10 Essential (primary) hypertension: Secondary | ICD-10-CM | POA: Diagnosis not present

## 2021-09-13 DIAGNOSIS — F339 Major depressive disorder, recurrent, unspecified: Secondary | ICD-10-CM | POA: Diagnosis not present

## 2021-09-13 DIAGNOSIS — M8588 Other specified disorders of bone density and structure, other site: Secondary | ICD-10-CM | POA: Diagnosis not present

## 2021-09-13 DIAGNOSIS — K219 Gastro-esophageal reflux disease without esophagitis: Secondary | ICD-10-CM | POA: Diagnosis not present

## 2021-09-13 DIAGNOSIS — M19042 Primary osteoarthritis, left hand: Secondary | ICD-10-CM | POA: Diagnosis not present

## 2021-09-13 DIAGNOSIS — Z91038 Other insect allergy status: Secondary | ICD-10-CM | POA: Diagnosis not present

## 2021-09-13 DIAGNOSIS — E785 Hyperlipidemia, unspecified: Secondary | ICD-10-CM | POA: Diagnosis not present

## 2021-09-13 DIAGNOSIS — M19041 Primary osteoarthritis, right hand: Secondary | ICD-10-CM | POA: Diagnosis not present

## 2021-09-13 DIAGNOSIS — G43909 Migraine, unspecified, not intractable, without status migrainosus: Secondary | ICD-10-CM | POA: Diagnosis not present

## 2021-09-13 DIAGNOSIS — I10 Essential (primary) hypertension: Secondary | ICD-10-CM | POA: Diagnosis not present

## 2021-09-13 DIAGNOSIS — Z0000 Encounter for general adult medical examination without abnormal findings: Secondary | ICD-10-CM | POA: Diagnosis not present

## 2021-09-13 DIAGNOSIS — Z79899 Other long term (current) drug therapy: Secondary | ICD-10-CM | POA: Diagnosis not present

## 2021-09-17 ENCOUNTER — Other Ambulatory Visit: Payer: Self-pay | Admitting: Family Medicine

## 2021-09-17 DIAGNOSIS — F331 Major depressive disorder, recurrent, moderate: Secondary | ICD-10-CM | POA: Diagnosis not present

## 2021-09-17 DIAGNOSIS — Z630 Problems in relationship with spouse or partner: Secondary | ICD-10-CM | POA: Diagnosis not present

## 2021-09-17 DIAGNOSIS — Z1231 Encounter for screening mammogram for malignant neoplasm of breast: Secondary | ICD-10-CM

## 2021-09-17 DIAGNOSIS — F411 Generalized anxiety disorder: Secondary | ICD-10-CM | POA: Diagnosis not present

## 2021-10-05 DIAGNOSIS — G5603 Carpal tunnel syndrome, bilateral upper limbs: Secondary | ICD-10-CM | POA: Diagnosis not present

## 2021-10-12 ENCOUNTER — Ambulatory Visit
Admission: RE | Admit: 2021-10-12 | Discharge: 2021-10-12 | Disposition: A | Discharge status: Home / Self Care | Payer: Medicare HMO | Source: Ambulatory Visit | Attending: Family Medicine | Admitting: Family Medicine

## 2021-10-12 DIAGNOSIS — M8589 Other specified disorders of bone density and structure, multiple sites: Secondary | ICD-10-CM | POA: Diagnosis not present

## 2021-10-12 DIAGNOSIS — Z780 Asymptomatic menopausal state: Secondary | ICD-10-CM | POA: Diagnosis not present

## 2021-10-12 DIAGNOSIS — Z1231 Encounter for screening mammogram for malignant neoplasm of breast: Secondary | ICD-10-CM

## 2021-11-15 ENCOUNTER — Ambulatory Visit
Admission: RE | Admit: 2021-11-15 | Discharge: 2021-11-15 | Disposition: A | Discharge status: Home / Self Care | Payer: Medicare HMO | Source: Ambulatory Visit | Attending: Family Medicine | Admitting: Family Medicine

## 2021-11-15 DIAGNOSIS — Z1231 Encounter for screening mammogram for malignant neoplasm of breast: Secondary | ICD-10-CM

## 2021-12-26 DIAGNOSIS — M180 Bilateral primary osteoarthritis of first carpometacarpal joints: Secondary | ICD-10-CM | POA: Diagnosis not present

## 2021-12-26 DIAGNOSIS — G5603 Carpal tunnel syndrome, bilateral upper limbs: Secondary | ICD-10-CM | POA: Diagnosis not present

## 2022-03-13 DIAGNOSIS — E785 Hyperlipidemia, unspecified: Secondary | ICD-10-CM | POA: Diagnosis not present

## 2022-03-13 DIAGNOSIS — R202 Paresthesia of skin: Secondary | ICD-10-CM | POA: Diagnosis not present

## 2022-03-13 DIAGNOSIS — M19049 Primary osteoarthritis, unspecified hand: Secondary | ICD-10-CM | POA: Diagnosis not present

## 2022-03-13 DIAGNOSIS — M542 Cervicalgia: Secondary | ICD-10-CM | POA: Diagnosis not present

## 2022-03-13 DIAGNOSIS — I10 Essential (primary) hypertension: Secondary | ICD-10-CM | POA: Diagnosis not present

## 2022-03-13 DIAGNOSIS — F419 Anxiety disorder, unspecified: Secondary | ICD-10-CM | POA: Diagnosis not present

## 2022-03-13 DIAGNOSIS — G43909 Migraine, unspecified, not intractable, without status migrainosus: Secondary | ICD-10-CM | POA: Diagnosis not present

## 2022-03-13 DIAGNOSIS — F339 Major depressive disorder, recurrent, unspecified: Secondary | ICD-10-CM | POA: Diagnosis not present

## 2022-03-13 DIAGNOSIS — G4700 Insomnia, unspecified: Secondary | ICD-10-CM | POA: Diagnosis not present

## 2022-03-26 DIAGNOSIS — M62512 Muscle wasting and atrophy, not elsewhere classified, left shoulder: Secondary | ICD-10-CM | POA: Diagnosis not present

## 2022-03-26 DIAGNOSIS — M2569 Stiffness of other specified joint, not elsewhere classified: Secondary | ICD-10-CM | POA: Diagnosis not present

## 2022-03-26 DIAGNOSIS — M542 Cervicalgia: Secondary | ICD-10-CM | POA: Diagnosis not present

## 2022-03-26 DIAGNOSIS — R293 Abnormal posture: Secondary | ICD-10-CM | POA: Diagnosis not present

## 2022-04-01 DIAGNOSIS — M542 Cervicalgia: Secondary | ICD-10-CM | POA: Diagnosis not present

## 2022-04-01 DIAGNOSIS — R293 Abnormal posture: Secondary | ICD-10-CM | POA: Diagnosis not present

## 2022-04-01 DIAGNOSIS — M2569 Stiffness of other specified joint, not elsewhere classified: Secondary | ICD-10-CM | POA: Diagnosis not present

## 2022-04-01 DIAGNOSIS — M62512 Muscle wasting and atrophy, not elsewhere classified, left shoulder: Secondary | ICD-10-CM | POA: Diagnosis not present

## 2022-04-03 DIAGNOSIS — R293 Abnormal posture: Secondary | ICD-10-CM | POA: Diagnosis not present

## 2022-04-03 DIAGNOSIS — M542 Cervicalgia: Secondary | ICD-10-CM | POA: Diagnosis not present

## 2022-04-03 DIAGNOSIS — M2569 Stiffness of other specified joint, not elsewhere classified: Secondary | ICD-10-CM | POA: Diagnosis not present

## 2022-04-03 DIAGNOSIS — M62512 Muscle wasting and atrophy, not elsewhere classified, left shoulder: Secondary | ICD-10-CM | POA: Diagnosis not present

## 2022-04-08 DIAGNOSIS — M2569 Stiffness of other specified joint, not elsewhere classified: Secondary | ICD-10-CM | POA: Diagnosis not present

## 2022-04-08 DIAGNOSIS — R293 Abnormal posture: Secondary | ICD-10-CM | POA: Diagnosis not present

## 2022-04-08 DIAGNOSIS — M62512 Muscle wasting and atrophy, not elsewhere classified, left shoulder: Secondary | ICD-10-CM | POA: Diagnosis not present

## 2022-04-08 DIAGNOSIS — M542 Cervicalgia: Secondary | ICD-10-CM | POA: Diagnosis not present

## 2022-04-15 DIAGNOSIS — M2569 Stiffness of other specified joint, not elsewhere classified: Secondary | ICD-10-CM | POA: Diagnosis not present

## 2022-04-15 DIAGNOSIS — M62512 Muscle wasting and atrophy, not elsewhere classified, left shoulder: Secondary | ICD-10-CM | POA: Diagnosis not present

## 2022-04-15 DIAGNOSIS — R293 Abnormal posture: Secondary | ICD-10-CM | POA: Diagnosis not present

## 2022-04-15 DIAGNOSIS — M542 Cervicalgia: Secondary | ICD-10-CM | POA: Diagnosis not present

## 2022-04-17 DIAGNOSIS — M2569 Stiffness of other specified joint, not elsewhere classified: Secondary | ICD-10-CM | POA: Diagnosis not present

## 2022-04-17 DIAGNOSIS — M62512 Muscle wasting and atrophy, not elsewhere classified, left shoulder: Secondary | ICD-10-CM | POA: Diagnosis not present

## 2022-04-17 DIAGNOSIS — R293 Abnormal posture: Secondary | ICD-10-CM | POA: Diagnosis not present

## 2022-04-17 DIAGNOSIS — M542 Cervicalgia: Secondary | ICD-10-CM | POA: Diagnosis not present

## 2022-04-26 DIAGNOSIS — R293 Abnormal posture: Secondary | ICD-10-CM | POA: Diagnosis not present

## 2022-04-26 DIAGNOSIS — M62512 Muscle wasting and atrophy, not elsewhere classified, left shoulder: Secondary | ICD-10-CM | POA: Diagnosis not present

## 2022-04-26 DIAGNOSIS — M542 Cervicalgia: Secondary | ICD-10-CM | POA: Diagnosis not present

## 2022-04-26 DIAGNOSIS — M2569 Stiffness of other specified joint, not elsewhere classified: Secondary | ICD-10-CM | POA: Diagnosis not present

## 2022-05-01 DIAGNOSIS — R293 Abnormal posture: Secondary | ICD-10-CM | POA: Diagnosis not present

## 2022-05-01 DIAGNOSIS — M542 Cervicalgia: Secondary | ICD-10-CM | POA: Diagnosis not present

## 2022-05-01 DIAGNOSIS — M2569 Stiffness of other specified joint, not elsewhere classified: Secondary | ICD-10-CM | POA: Diagnosis not present

## 2022-05-01 DIAGNOSIS — M62512 Muscle wasting and atrophy, not elsewhere classified, left shoulder: Secondary | ICD-10-CM | POA: Diagnosis not present

## 2022-05-06 DIAGNOSIS — M2569 Stiffness of other specified joint, not elsewhere classified: Secondary | ICD-10-CM | POA: Diagnosis not present

## 2022-05-06 DIAGNOSIS — M62512 Muscle wasting and atrophy, not elsewhere classified, left shoulder: Secondary | ICD-10-CM | POA: Diagnosis not present

## 2022-05-06 DIAGNOSIS — M542 Cervicalgia: Secondary | ICD-10-CM | POA: Diagnosis not present

## 2022-05-06 DIAGNOSIS — R293 Abnormal posture: Secondary | ICD-10-CM | POA: Diagnosis not present

## 2022-05-08 DIAGNOSIS — M2569 Stiffness of other specified joint, not elsewhere classified: Secondary | ICD-10-CM | POA: Diagnosis not present

## 2022-05-08 DIAGNOSIS — M542 Cervicalgia: Secondary | ICD-10-CM | POA: Diagnosis not present

## 2022-05-08 DIAGNOSIS — M62512 Muscle wasting and atrophy, not elsewhere classified, left shoulder: Secondary | ICD-10-CM | POA: Diagnosis not present

## 2022-05-08 DIAGNOSIS — R293 Abnormal posture: Secondary | ICD-10-CM | POA: Diagnosis not present

## 2022-05-17 DIAGNOSIS — M19041 Primary osteoarthritis, right hand: Secondary | ICD-10-CM | POA: Diagnosis not present

## 2022-05-17 DIAGNOSIS — M19042 Primary osteoarthritis, left hand: Secondary | ICD-10-CM | POA: Diagnosis not present

## 2022-05-17 DIAGNOSIS — S61432A Puncture wound without foreign body of left hand, initial encounter: Secondary | ICD-10-CM | POA: Diagnosis not present

## 2022-06-26 DIAGNOSIS — G5603 Carpal tunnel syndrome, bilateral upper limbs: Secondary | ICD-10-CM | POA: Diagnosis not present

## 2022-06-26 DIAGNOSIS — M180 Bilateral primary osteoarthritis of first carpometacarpal joints: Secondary | ICD-10-CM | POA: Diagnosis not present

## 2022-07-15 DIAGNOSIS — F332 Major depressive disorder, recurrent severe without psychotic features: Secondary | ICD-10-CM | POA: Diagnosis not present

## 2022-07-15 DIAGNOSIS — Z630 Problems in relationship with spouse or partner: Secondary | ICD-10-CM | POA: Diagnosis not present

## 2022-07-15 DIAGNOSIS — F411 Generalized anxiety disorder: Secondary | ICD-10-CM | POA: Diagnosis not present

## 2022-09-05 DIAGNOSIS — F419 Anxiety disorder, unspecified: Secondary | ICD-10-CM | POA: Diagnosis not present

## 2022-09-05 DIAGNOSIS — E785 Hyperlipidemia, unspecified: Secondary | ICD-10-CM | POA: Diagnosis not present

## 2022-09-05 DIAGNOSIS — I10 Essential (primary) hypertension: Secondary | ICD-10-CM | POA: Diagnosis not present

## 2022-09-05 DIAGNOSIS — K219 Gastro-esophageal reflux disease without esophagitis: Secondary | ICD-10-CM | POA: Diagnosis not present

## 2022-09-05 DIAGNOSIS — M8588 Other specified disorders of bone density and structure, other site: Secondary | ICD-10-CM | POA: Diagnosis not present

## 2022-09-05 DIAGNOSIS — Z0000 Encounter for general adult medical examination without abnormal findings: Secondary | ICD-10-CM | POA: Diagnosis not present

## 2022-09-05 DIAGNOSIS — E538 Deficiency of other specified B group vitamins: Secondary | ICD-10-CM | POA: Diagnosis not present

## 2022-09-05 DIAGNOSIS — Z23 Encounter for immunization: Secondary | ICD-10-CM | POA: Diagnosis not present

## 2022-09-05 DIAGNOSIS — F339 Major depressive disorder, recurrent, unspecified: Secondary | ICD-10-CM | POA: Diagnosis not present

## 2022-10-07 DIAGNOSIS — F332 Major depressive disorder, recurrent severe without psychotic features: Secondary | ICD-10-CM | POA: Diagnosis not present

## 2022-10-07 DIAGNOSIS — F411 Generalized anxiety disorder: Secondary | ICD-10-CM | POA: Diagnosis not present

## 2022-10-07 DIAGNOSIS — Z630 Problems in relationship with spouse or partner: Secondary | ICD-10-CM | POA: Diagnosis not present

## 2022-11-11 DIAGNOSIS — H2513 Age-related nuclear cataract, bilateral: Secondary | ICD-10-CM | POA: Diagnosis not present

## 2022-11-11 DIAGNOSIS — Z0100 Encounter for examination of eyes and vision without abnormal findings: Secondary | ICD-10-CM | POA: Diagnosis not present

## 2022-12-25 DIAGNOSIS — M180 Bilateral primary osteoarthritis of first carpometacarpal joints: Secondary | ICD-10-CM | POA: Diagnosis not present

## 2022-12-25 DIAGNOSIS — G5603 Carpal tunnel syndrome, bilateral upper limbs: Secondary | ICD-10-CM | POA: Diagnosis not present

## 2023-01-06 DIAGNOSIS — Z630 Problems in relationship with spouse or partner: Secondary | ICD-10-CM | POA: Diagnosis not present

## 2023-01-06 DIAGNOSIS — F411 Generalized anxiety disorder: Secondary | ICD-10-CM | POA: Diagnosis not present

## 2023-01-06 DIAGNOSIS — F332 Major depressive disorder, recurrent severe without psychotic features: Secondary | ICD-10-CM | POA: Diagnosis not present

## 2023-01-20 ENCOUNTER — Encounter: Payer: Self-pay | Admitting: Cardiology

## 2023-01-20 ENCOUNTER — Other Ambulatory Visit (INDEPENDENT_AMBULATORY_CARE_PROVIDER_SITE_OTHER): Payer: Medicare HMO

## 2023-01-20 ENCOUNTER — Ambulatory Visit: Payer: Medicare HMO

## 2023-01-20 ENCOUNTER — Ambulatory Visit: Payer: Medicare HMO | Attending: Cardiology | Admitting: Cardiology

## 2023-01-20 ENCOUNTER — Other Ambulatory Visit: Payer: Self-pay | Admitting: Family Medicine

## 2023-01-20 VITALS — BP 114/72 | HR 66 | Ht 62.0 in | Wt 132.0 lb

## 2023-01-20 DIAGNOSIS — I10 Essential (primary) hypertension: Secondary | ICD-10-CM

## 2023-01-20 DIAGNOSIS — E785 Hyperlipidemia, unspecified: Secondary | ICD-10-CM

## 2023-01-20 DIAGNOSIS — Z9181 History of falling: Secondary | ICD-10-CM | POA: Diagnosis not present

## 2023-01-20 DIAGNOSIS — R918 Other nonspecific abnormal finding of lung field: Secondary | ICD-10-CM

## 2023-01-20 DIAGNOSIS — R079 Chest pain, unspecified: Secondary | ICD-10-CM | POA: Diagnosis not present

## 2023-01-20 DIAGNOSIS — R002 Palpitations: Secondary | ICD-10-CM

## 2023-01-20 DIAGNOSIS — R55 Syncope and collapse: Secondary | ICD-10-CM | POA: Diagnosis not present

## 2023-01-20 DIAGNOSIS — R519 Headache, unspecified: Secondary | ICD-10-CM | POA: Diagnosis not present

## 2023-01-20 DIAGNOSIS — R7989 Other specified abnormal findings of blood chemistry: Secondary | ICD-10-CM | POA: Diagnosis not present

## 2023-01-20 DIAGNOSIS — S0012XA Contusion of left eyelid and periocular area, initial encounter: Secondary | ICD-10-CM | POA: Diagnosis not present

## 2023-01-20 NOTE — Progress Notes (Unsigned)
Enrolled for [employer] to mail a ZIO XT long term holter monitor to the patients address on file.  

## 2023-01-20 NOTE — Patient Instructions (Signed)
Medication Instructions:  Your physician recommends that you continue on your current medications as directed. Please refer to the Current Medication list given to you today.  *If you need a refill on your cardiac medications before your next appointment, please call your pharmacy*   Lab Work: CMET, CBC, TSH today  If you have labs (blood work) drawn today and your tests are completely normal, you will receive your results only by: [place] (if you have MyChart) OR A paper copy in the mail If you have any lab test that is abnormal or we need to change your treatment, we will call you to review the results.   Testing/Procedures: Your physician has requested that you have an echocardiogram. Echocardiography is a painless test that uses sound waves to create images of your heart. It provides your doctor with information about the size and shape of your heart and how well your heart's chambers and valves are working. This procedure takes approximately one hour. There are no restrictions for this procedure. Please do NOT wear cologne, perfume, aftershave, or lotions (deodorant is allowed). Please arrive 15 minutes prior to your appointment time.  ZIO XT- Long Term Monitor Instructions   Your physician has requested you wear a ZIO patch monitor for _14_ days.  This is a single patch monitor.   [employer] supplies one patch monitor per enrollment. Additional stickers are not available. Please do not apply patch if you will be having a Nuclear Stress Test, Echocardiogram, Cardiac CT, MRI, or Chest Xray during the period you would be wearing the monitor. The patch cannot be worn during these tests. You cannot remove and re-apply the ZIO XT patch monitor.  Your ZIO patch monitor will be sent Fed Ex from [employer] directly to your home address. It may take 3-5 days to receive your monitor after you have been enrolled.  Once you have received your monitor, please review the enclosed  instructions. Your monitor has already been registered assigning a specific monitor serial # to you.  Billing and Patient Assistance Program Information   We have supplied [employer] with any of your insurance information on file for billing purposes. [employer] offers a sliding scale Patient Assistance Program for patients that do not have insurance, or whose insurance does not completely cover the cost of the ZIO monitor.   You must apply for the Patient Assistance Program to qualify for this discounted rate.     To apply, please call [employer] at [phone], select option 4, then select option 2, and ask to apply for Patient Assistance Program.  Theodore Demark will ask your household income, and how many people are in your household.  They will quote your out-of-pocket cost based on that information.  [employer] will also be able to set up a 58-month interest-free payment plan if needed.  Applying the monitor   Shave hair from upper left chest.  Hold abrader disc by orange tab. Rub abrader in 40 strokes over the upper left chest as indicated in your monitor instructions.  Clean area with 4 enclosed alcohol pads. Let dry.  Apply patch as indicated in monitor instructions. Patch will be placed under collarbone on left side of chest with arrow pointing upward.  Rub patch adhesive wings for 2 minutes. Remove white label marked "1". Remove the white label marked "2". Rub patch adhesive wings for 2 additional minutes.  While looking in a mirror, press and release button in center of patch. A small green light will flash 3-4 times. This  will be your only indicator that the monitor has been turned on. ?  Do not shower for the first 24 hours. You may shower after the first 24 hours.  Press the button if you feel a symptom. You will hear a small click. Record Date, Time and Symptom in the Patient Logbook.  When you are ready to remove the patch, follow instructions on the last 2 pages of the Patient Logbook. Stick patch  monitor onto the last page of Patient Logbook.  Place Patient Logbook in the blue and white box.  Use locking tab on box and tape box closed securely.  The blue and white box has prepaid postage on it. Please place it in the mailbox as soon as possible. Your physician should have your test results approximately 7 days after the monitor has been mailed back to [facility].  Call [place] at [phone] if you have questions regarding your ZIO XT patch monitor. Call them immediately if you see an orange light blinking on your monitor.  If your monitor falls off in less than 4 days, contact our Monitor department at [phone]. ?If your monitor becomes loose or falls off after 4 days call [employer] at [phone] for suggestions on securing your monitor.?  Follow-Up: At [facility], you and your health needs are our priority.  As part of our continuing mission to provide you with exceptional heart care, we have created designated Provider Care Teams.  These Care Teams include your primary Cardiologist (physician) and Advanced Practice Providers (APPs -  Physician Assistants and Nurse Practitioners) who all work together to provide you with the care you need, when you need it.  We recommend signing up for the patient portal called "MyChart".  Sign up information is provided on this After Visit Summary.  MyChart is used to connect with patients for Virtual Visits (Telemedicine).  Patients are able to view lab/test results, encounter notes, upcoming appointments, etc.  Non-urgent messages can be sent to your provider as well.   To learn more about what you can do with MyChart, go to [email or web address].    Your next appointment:   3 month(s)  Provider:   Donato Heinz, MD

## 2023-01-20 NOTE — Progress Notes (Unsigned)
Enrolled for [employer] to mail a ZIO AT Live Telemetry monitor to patients address on file.  

## 2023-01-20 NOTE — Progress Notes (Unsigned)
Cardiology Office Note:    Date:  01/22/2023   ID:  Sherri Atkins, DOB 21-Feb-1951, MRN [id number]  PCP:  Harlan Stains, MD  Cardiologist:  Donato Heinz, MD  Electrophysiologist:  None   Referring MD: Harlan Stains, MD   No chief complaint on file.   History of Present Illness:    Sherri Atkins is a 72 y.o. female with a hx of hypertension, anxiety, depression who presents for follow-up.  She was referred by Dr. Dema Severin for evaluation of chest pain, initially seen on 01/10/2020.  She reports that chest pain started in the prior few months.  Describes pressure in center of her chest, occurs when she is stressed.  Also feels short of breath.  Has been occurring a couple times per week.  In addition she has a second type of chest pain that she describes as occasional dull aching pain.  For exercise, she does gardening and will walk for 20 to 40 minutes about twice per week.  She denies any exertional symptoms.  Prescribed omeprazole for GERD, but has not started yet.  Reports BP under good control.  LDL 115 on 12/20/19.  Never smoked.  No family history heart disease.   TTE on 01/10/2020 showed LVEF 60 to 123456, grade 2 diastolic dysfunction, normal RV function, no significant valvular disease.  Cardiac CTA on 02/16/2020 showed calcium score 0.4 (47th percentile), minimal nonobstructive CAD  Also noted to have small lung nodules measuring 4 mm or less.  Zio patch x 14 days on 04/25/2021 showed 8 episodes of SVT, longest lasting 21 seconds with average rate 124 bpm.  Since last clinic visit, she reports she is having dyspnea on exertion.  States that recently has been having lightheadedness.  Yesterday had syncopal episode.  States that she felt nauseous, neck pain and stomachache.  She was brushing her teeth, had been standing for about 10 minutes.  After about a minute of symptoms, states that she fell and hit her eye.  She denies any chest pain.  Past Medical History:  Diagnosis Date    Anxiety    Bee sting allergy    Depression    High blood pressure    Migraines     Past Surgical History:  Procedure Laterality Date   FOOT SURGERY  1999    Current Medications: Current Meds  Medication Sig   amLODipine (NORVASC) 5 MG tablet Take 5 mg by mouth daily.   calcium carbonate 100 mg/ml SUSP Take by mouth.   celecoxib (CELEBREX) 200 MG capsule Take by mouth.   EPINEPHrine 0.3 mg/0.3 mL IJ SOAJ injection Inject 0.3 mg into the muscle once.   ergocalciferol (VITAMIN D2) 1.25 MG (50000 UT) capsule Take by mouth.   hydrochlorothiazide (MICROZIDE) 12.5 MG capsule Take 12.5 mg by mouth daily.   Multiple Vitamins-Minerals (ZINC PO) Take 1 tablet by mouth daily.   naproxen (NAPROSYN) 250 MG tablet Take by mouth 2 (two) times daily with a meal.   nitroGLYCERIN (NITROSTAT) 0.4 MG SL tablet    Omega-3 Fatty Acids (FISH OIL PO) Take 1 capsule by mouth daily.   omeprazole (PRILOSEC) 40 MG capsule    rizatriptan (MAXALT) 10 MG tablet Take 10 mg by mouth as needed for migraine. May repeat in 2 hours if needed   sertraline (ZOLOFT) 100 MG tablet Take 150 mg by mouth daily.    traZODone (DESYREL) 25 mg TABS tablet Take 25 mg by mouth at bedtime.     Allergies:  Other, Doxycycline, Lisinopril, Rexulti [brexpiprazole], Septra [sulfamethoxazole-trimethoprim], and Neosporin [neomycin-bacitracin zn-polymyx]   Social History   Socioeconomic History   Marital status: Married    Spouse name: Not on file   Number of children: Not on file   Years of education: Not on file   Highest education level: Not on file  Occupational History   Not on file  Tobacco Use   Smoking status: Never   Smokeless tobacco: Never  Substance and Sexual Activity   Alcohol use: No   Drug use: No   Sexual activity: Not on file  Other Topics Concern   Not on file  Social History Narrative   Not on file   Social Determinants of Health   Financial Resource Strain: Not on file  Food Insecurity: Not on  file  Transportation Needs: Not on file  Physical Activity: Not on file  Stress: Not on file  Social Connections: Not on file     Family History: The patient's family history includes Asthma in her brother; Constipation in her mother; Dementia in her mother; Depression in her mother, sister, and sister; Esophageal cancer in her brother; Hernia in her mother; Lung cancer in her father.  ROS:   Please see the history of present illness.     All other systems reviewed and are negative.  EKGs/Labs/Other Studies Reviewed:    The following studies were reviewed today:   EKG:   01/20/23: Inverted T waves in inferior leads, possibly ectopic atrial rhythm, rate 66, Q waves in V1/2 04/25/2021- The EKG ordered demonstrates sinus rhythm, rate 61, LVH, Q waves in lead 1 and lead 2   04/14/2020- The EKG ordered most recently demonstrates normal sinus rhythm, rate 65, no ST/T abnormalities     Recent Labs: 01/20/2023: ALT 24; BUN 15; Creatinine, Ser 0.89; Hemoglobin 14.0; Platelets 191; Potassium 4.3; Sodium 142; TSH 5.830  Recent Lipid Panel    Component Value Date/Time   CHOL 207 (H) 04/25/2021 0944   TRIG 142 04/25/2021 0944   HDL 58 04/25/2021 0944   CHOLHDL 3.6 04/25/2021 0944   LDLCALC 124 (H) 04/25/2021 0944    Physical Exam:    VS:  BP 114/72   Pulse 66   Ht '5\' 2"'$  (1.575 m)   Wt 132 lb (59.9 kg)   SpO2 97%   BMI 24.14 kg/m     Wt Readings from Last 3 Encounters:  01/20/23 132 lb (59.9 kg)  04/25/21 131 lb (59.4 kg)  04/14/20 133 lb 9.6 oz (60.6 kg)     GEN:  Well nourished, well developed in no acute distress HEENT: Normal NECK: No JVD; No carotid bruits CARDIAC: RRR, no murmurs, rubs, gallops RESPIRATORY:  Clear to auscultation without rales, wheezing or rhonchi  ABDOMEN: Soft, non-tender, non-distended MUSCULOSKELETAL:  No edema; No deformity  SKIN: Warm and dry NEUROLOGIC:  Alert and oriented x 3 PSYCHIATRIC:  Normal affect   ASSESSMENT:    1. Syncope,  unspecified syncope type   2. Essential hypertension   3. Chest pain, unspecified type   4. Palpitations   5. Hyperlipidemia, unspecified hyperlipidemia type   6. Pulmonary nodules     PLAN:    Syncope: Unclear etiology, does report some prodromal symptoms so may represent vasovagal syncope.  Recommend Zio patch x 2 weeks to evaluate for arrhythmia.  Check echocardiogram to rule out structural heart disease.  Did discuss [facility] regulations that patient should be free of syncope x 6 months to drive  Palpitations: Zio  patch x 14 days on 04/25/2021 showed 8 episodes of SVT, longest lasting 21 seconds with average rate 124 bpm, no symptoms reported during SVT episodes. -Plan repeat Zio patch given recent syncopal episode as above.  Chest pain: Atypical in description.  TTE on 01/10/2020 showed LVEF 60 to 123456, grade 2 diastolic dysfunction, normal RV function, no significant valvular disease.  Cardiac CTA on 02/16/2020 showed calcium score 0.4 (47th percentile), minimal nonobstructive CAD.  No further cardiac work-up recommended  Hypertension: On amlodipine 5 mg daily and hydrochlorothiazide 12.5 mg daily.  Appears well controlled.  Will check BMP, magnesium  Pulmonary nodules: Scattered subpleural nodules measuring 4 mm or less on coronary CTA on 02/16/2020.  Can consider repeat CT chest at 12 months, though as patient is low risk (no smoking history, no prior history of malignancy), no follow-up is needed  Hyperlipidemia: Will check lipid panel   RTC in 3 months  Medication Adjustments/Labs and Tests Ordered: Current medicines are reviewed at length with the patient today.  Concerns regarding medicines are outlined above.  Orders Placed This Encounter  Procedures   Comprehensive metabolic panel   CBC   TSH   T4, free   Specimen status report   LONG TERM MONITOR-LIVE TELEMETRY (3-14 DAYS)   EKG 12-Lead   ECHOCARDIOGRAM COMPLETE   No orders of the defined types were placed in  this encounter.   Patient Instructions  Medication Instructions:  Your physician recommends that you continue on your current medications as directed. Please refer to the Current Medication list given to you today.  *If you need a refill on your cardiac medications before your next appointment, please call your pharmacy*   Lab Work: CMET, CBC, TSH today  If you have labs (blood work) drawn today and your tests are completely normal, you will receive your results only by: [place] (if you have MyChart) OR A paper copy in the mail If you have any lab test that is abnormal or we need to change your treatment, we will call you to review the results.   Testing/Procedures: Your physician has requested that you have an echocardiogram. Echocardiography is a painless test that uses sound waves to create images of your heart. It provides your doctor with information about the size and shape of your heart and how well your heart's chambers and valves are working. This procedure takes approximately one hour. There are no restrictions for this procedure. Please do NOT wear cologne, perfume, aftershave, or lotions (deodorant is allowed). Please arrive 15 minutes prior to your appointment time.  ZIO XT- Long Term Monitor Instructions   Your physician has requested you wear a ZIO patch monitor for _14_ days.  This is a single patch monitor.   [employer] supplies one patch monitor per enrollment. Additional stickers are not available. Please do not apply patch if you will be having a Nuclear Stress Test, Echocardiogram, Cardiac CT, MRI, or Chest Xray during the period you would be wearing the monitor. The patch cannot be worn during these tests. You cannot remove and re-apply the ZIO XT patch monitor.  Your ZIO patch monitor will be sent Fed Ex from [employer] directly to your home address. It may take 3-5 days to receive your monitor after you have been enrolled.  Once you have received  your monitor, please review the enclosed instructions. Your monitor has already been registered assigning a specific monitor serial # to you.  Billing and Patient Assistance Program Information   We have  supplied [employer] with any of your insurance information on file for billing purposes. [employer] offers a sliding scale Patient Assistance Program for patients that do not have insurance, or whose insurance does not completely cover the cost of the ZIO monitor.   You must apply for the Patient Assistance Program to qualify for this discounted rate.     To apply, please call [employer] at [phone], select option 4, then select option 2, and ask to apply for Patient Assistance Program.  Theodore Demark will ask your household income, and how many people are in your household.  They will quote your out-of-pocket cost based on that information.  [employer] will also be able to set up a 53-month interest-free payment plan if needed.  Applying the monitor   Shave hair from upper left chest.  Hold abrader disc by orange tab. Rub abrader in 40 strokes over the upper left chest as indicated in your monitor instructions.  Clean area with 4 enclosed alcohol pads. Let dry.  Apply patch as indicated in monitor instructions. Patch will be placed under collarbone on left side of chest with arrow pointing upward.  Rub patch adhesive wings for 2 minutes. Remove white label marked "1". Remove the white label marked "2". Rub patch adhesive wings for 2 additional minutes.  While looking in a mirror, press and release button in center of patch. A small green light will flash 3-4 times. This will be your only indicator that the monitor has been turned on. ?  Do not shower for the first 24 hours. You may shower after the first 24 hours.  Press the button if you feel a symptom. You will hear a small click. Record Date, Time and Symptom in the Patient Logbook.  When you are ready to remove the patch, follow instructions on the last 2  pages of the Patient Logbook. Stick patch monitor onto the last page of Patient Logbook.  Place Patient Logbook in the blue and white box.  Use locking tab on box and tape box closed securely.  The blue and white box has prepaid postage on it. Please place it in the mailbox as soon as possible. Your physician should have your test results approximately 7 days after the monitor has been mailed back to [facility]  Call [facility]at [phone] you have questions regarding your ZIO XT patch monitor. Call them immediately if you see an orange light blinking on your monitor.  If your monitor falls off in less than 4 days, contact our Monitor department at [phone] ?If your monitor becomes loose or falls off after 4 days call [employer] at [phone] suggestions on securing your monitor.?  Follow-Up: [facility] you and your health needs are our priority.  As part of our continuing mission to provide you with exceptional heart care, we have created designated Provider Care Teams.  These Care Teams include your primary Cardiologist (physician) and Advanced Practice Providers (APPs -  Physician Assistants and Nurse Practitioners) who all work together to provide you with the care you need, when you need it.  We recommend signing up for the patient portal called "MyChart".  Sign up information is provided on this After Visit Summary.  MyChart is used to connect with patients for Virtual Visits (Telemedicine).  Patients are able to view lab/test results, encounter notes, upcoming appointments, etc.  Non-urgent messages can be sent to your provider as well.   To learn more about what you can do with MyChart,  go to [email or web address].    Your next appointment:   3 month(s)  Provider:   Donato Heinz, MD          Signed, Donato Heinz, MD  01/22/2023 10:25 AM    [place]

## 2023-01-21 ENCOUNTER — Other Ambulatory Visit: Payer: Medicare HMO

## 2023-01-21 ENCOUNTER — Other Ambulatory Visit: Payer: Self-pay | Admitting: *Deleted

## 2023-01-21 DIAGNOSIS — R7989 Other specified abnormal findings of blood chemistry: Secondary | ICD-10-CM

## 2023-01-21 LAB — COMPREHENSIVE METABOLIC PANEL
ALT: 24 IU/L (ref 0–32)
AST: 24 IU/L (ref 0–40)
Albumin/Globulin Ratio: 2 (ref 1.2–2.2)
Albumin: 4.5 g/dL (ref 3.9–4.9)
Alkaline Phosphatase: 116 IU/L (ref 44–121)
BUN/Creatinine Ratio: 17 (ref 12–28)
BUN: 15 mg/dL (ref 8–27)
Bilirubin Total: 0.2 mg/dL (ref 0.0–1.2)
CO2: 26 mmol/L (ref 20–29)
Calcium: 10.3 mg/dL (ref 8.7–10.3)
Chloride: 101 mmol/L (ref 96–106)
Creatinine, Ser: 0.89 mg/dL (ref 0.57–1.00)
Globulin, Total: 2.3 g/dL (ref 1.5–4.5)
Glucose: 89 mg/dL (ref 70–99)
Potassium: 4.3 mmol/L (ref 3.5–5.2)
Sodium: 142 mmol/L (ref 134–144)
Total Protein: 6.8 g/dL (ref 6.0–8.5)
eGFR: 70 mL/min/{1.73_m2} (ref >59)

## 2023-01-21 LAB — TSH: TSH: 5.83 u[IU]/mL — ABNORMAL HIGH (ref 0.450–4.500)

## 2023-01-21 LAB — CBC
Hematocrit: 41.6 % (ref 34.0–46.6)
Hemoglobin: 14 g/dL (ref 11.1–15.9)
MCH: 30.7 pg (ref 26.6–33.0)
MCHC: 33.7 g/dL (ref 31.5–35.7)
MCV: 91 fL (ref 79–97)
Platelets: 191 10*3/uL (ref 150–450)
RBC: 4.56 x10E6/uL (ref 3.77–5.28)
RDW: 12.2 % (ref 11.7–15.4)
WBC: 7.2 10*3/uL (ref 3.4–10.8)

## 2023-01-22 ENCOUNTER — Other Ambulatory Visit: Payer: Medicare HMO

## 2023-01-23 ENCOUNTER — Ambulatory Visit
Admission: RE | Admit: 2023-01-23 | Discharge: 2023-01-23 | Disposition: A | Discharge status: Home / Self Care | Payer: Medicare HMO | Source: Ambulatory Visit | Attending: Family Medicine | Admitting: Family Medicine

## 2023-01-23 DIAGNOSIS — R519 Headache, unspecified: Secondary | ICD-10-CM

## 2023-01-23 DIAGNOSIS — Z043 Encounter for examination and observation following other accident: Secondary | ICD-10-CM | POA: Diagnosis not present

## 2023-01-26 DIAGNOSIS — R55 Syncope and collapse: Secondary | ICD-10-CM | POA: Diagnosis not present

## 2023-01-27 DIAGNOSIS — R55 Syncope and collapse: Secondary | ICD-10-CM | POA: Diagnosis not present

## 2023-01-30 ENCOUNTER — Encounter: Payer: Self-pay | Admitting: *Deleted

## 2023-02-12 LAB — T4, FREE: Free T4: 1.14 ng/dL (ref 0.82–1.77)

## 2023-02-12 LAB — SPECIMEN STATUS REPORT

## 2023-02-19 ENCOUNTER — Ambulatory Visit (HOSPITAL_COMMUNITY): Payer: Medicare HMO

## 2023-02-24 ENCOUNTER — Encounter: Payer: Self-pay | Admitting: Neurology

## 2023-02-24 ENCOUNTER — Ambulatory Visit (INDEPENDENT_AMBULATORY_CARE_PROVIDER_SITE_OTHER): Payer: Medicare HMO | Admitting: Neurology

## 2023-02-24 VITALS — BP 148/89 | HR 65 | Ht 62.0 in | Wt 131.0 lb

## 2023-02-24 DIAGNOSIS — F419 Anxiety disorder, unspecified: Secondary | ICD-10-CM

## 2023-02-24 DIAGNOSIS — R55 Syncope and collapse: Secondary | ICD-10-CM | POA: Diagnosis not present

## 2023-02-24 MED ORDER — HYDROXYZINE PAMOATE 100 MG PO CAPS: 100.0000 mg | ORAL_CAPSULE | Freq: Three times a day (TID) | ORAL | 0 refills | Status: AC | PRN

## 2023-02-24 NOTE — Progress Notes (Unsigned)
[facility]  PATIENT: Sherri Atkins DOB: 1951-10-25  REQUESTING CLINICIAN: Antony Contras, MD HISTORY FROM: Patient REASON FOR VISIT: Syncope/near syncope    HISTORICAL  CHIEF COMPLAINT:  Chief Complaint  Patient presents with   New Patient (Initial Visit)    Rm 12. Alone. NP Paper proficient referral for Syncope / Antony Contras MD [facility].    HISTORY OF PRESENT ILLNESS:  This is a 72 year old woman past medical history of anxiety/depression, insomnia, headaches who is presenting after a syncopal episode.  Patient reports episodes of shortness of breath, abdominal pain, palpitation and feeling like she is going to pass out.  She does live with her granddaughter who has epilepsy, she reports that her grandson who does not live with the patient has essential tremor.  She is always worried about her grandkids because their parents passed away.  She reports usually in the morning she will have episodes of shortness of breath, abdominal pain, feeling like she is going to pass out.  These usually happen early in the morning when she is thinking about her granddaughter who come and stay with them over the weekend.  She reports a month ago she had the same feeling with shortness of breath, feeling abdominal pain then she passed out and possibly hit the back of her head.  She did not hit the ground.  She has followed up with her PCP who ordered a head CT which was normal.  She also follow-up with cardiology, had a cardiac monitor and was told that everything was fine.  She denies any loss of consciousness, tongue biting or loss of urine during that specific event.  She has been doing well since.  She is on Zoloft 150 mg daily for anxiety/depression.   OTHER MEDICAL CONDITIONS: Anxiety, Insomnia, Headaches    REVIEW OF SYSTEMS: Full 14 system review of systems performed and negative with exception of: As noted in the HPI   ALLERGIES: Allergies  Allergen Reactions    Other Anaphylaxis and Other (See Comments)    Bee Stings    Doxycycline Nausea Only   Lisinopril Swelling   Rexulti [Brexpiprazole] Other (See Comments)    Memory loss   Septra [Sulfamethoxazole-Trimethoprim] Other (See Comments)    heartburn   Neosporin [Neomycin-Bacitracin Zn-Polymyx] Rash    HOME MEDICATIONS: Outpatient Medications Prior to Visit  Medication Sig Dispense Refill   amLODipine (NORVASC) 5 MG tablet Take 5 mg by mouth daily.     CALCIUM PO Take 1 tablet by mouth.     celecoxib (CELEBREX) 200 MG capsule Take by mouth.     EPINEPHrine 0.3 mg/0.3 mL IJ SOAJ injection Inject 0.3 mg into the muscle once.     ergocalciferol (VITAMIN D2) 1.25 MG (50000 UT) capsule Take by mouth.     hydrochlorothiazide (MICROZIDE) 12.5 MG capsule Take 12.5 mg by mouth daily.     Multiple Vitamins-Minerals (ZINC PO) Take 1 tablet by mouth daily.     nitroGLYCERIN (NITROSTAT) 0.4 MG SL tablet      Omega-3 Fatty Acids (FISH OIL PO) Take 1 capsule by mouth daily.     rizatriptan (MAXALT) 10 MG tablet Take 10 mg by mouth as needed for migraine. May repeat in 2 hours if needed     rosuvastatin (CRESTOR) 10 MG tablet Take 1 tablet (10 mg total) by mouth daily. 90 tablet 3   sertraline (ZOLOFT) 100 MG tablet Take 150 mg by mouth daily.      traZODone (DESYREL) 25  mg TABS tablet Take 25 mg by mouth at bedtime.     calcium carbonate 100 mg/ml SUSP Take by mouth.     naproxen (NAPROSYN) 250 MG tablet Take by mouth 2 (two) times daily with a meal.     omeprazole (PRILOSEC) 40 MG capsule      No facility-administered medications prior to visit.    PAST MEDICAL HISTORY: Past Medical History:  Diagnosis Date   Anxiety    Bee sting allergy    Depression    High blood pressure    Migraines     PAST SURGICAL HISTORY: Past Surgical History:  Procedure Laterality Date   FOOT SURGERY  1999    FAMILY HISTORY: Family History  Problem Relation Age of Onset   Depression Mother    Hernia  Mother    Constipation Mother    Dementia Mother    Lung cancer Father    Depression Sister    Depression Sister    Asthma Brother    Esophageal cancer Brother    Epilepsy Granddaughter    Tremor Grandson     SOCIAL HISTORY: Social History   Socioeconomic History   Marital status: Married    Spouse name: Not on file   Number of children: Not on file   Years of education: Not on file   Highest education level: Not on file  Occupational History   Not on file  Tobacco Use   Smoking status: Never   Smokeless tobacco: Never  Substance and Sexual Activity   Alcohol use: No   Drug use: No   Sexual activity: Not on file  Other Topics Concern   Not on file  Social History Narrative   Not on file   Social Determinants of Health   Financial Resource Strain: Not on file  Food Insecurity: Not on file  Transportation Needs: Not on file  Physical Activity: Not on file  Stress: Not on file  Social Connections: Not on file  Intimate Partner Violence: Not on file    PHYSICAL EXAM  GENERAL EXAM/CONSTITUTIONAL: Vitals:  Vitals:   02/24/23 1045  BP: (!) 148/89  Pulse: 65  Weight: 131 lb (59.4 kg)  Height: 5\' 2"  (1.575 m)   Body mass index is 23.96 kg/m. Wt Readings from Last 3 Encounters:  02/24/23 131 lb (59.4 kg)  01/20/23 132 lb (59.9 kg)  04/25/21 131 lb (59.4 kg)   Patient is in no distress; well developed, nourished and groomed; neck is supple  EYES: Visual fields full to confrontation, Extraocular movements intacts,   MUSCULOSKELETAL: Gait, strength, tone, movements noted in Neurologic exam below  NEUROLOGIC: MENTAL STATUS:      No data to display         awake, alert, oriented to person, place and time recent and remote memory intact normal attention and concentration language fluent, comprehension intact, naming intact fund of knowledge appropriate  CRANIAL NERVE:  2nd, 3rd, 4th, 6th - Visual fields full to confrontation, extraocular muscles  intact, no nystagmus 5th - facial sensation symmetric 7th - facial strength symmetric 8th - hearing intact 9th - palate elevates symmetrically, uvula midline 11th - shoulder shrug symmetric 12th - tongue protrusion midline  MOTOR:  normal bulk and tone, full strength in the BUE, BLE  SENSORY:  normal and symmetric to light touch  COORDINATION:  finger-nose-finger, fine finger movements normal  REFLEXES:  deep tendon reflexes present and symmetric  GAIT/STATION:  normal     DIAGNOSTIC DATA (LABS, IMAGING, TESTING) -  I reviewed patient records, labs, notes, testing and imaging myself where available.  Lab Results  Component Value Date   WBC 7.2 01/20/2023   HGB 14.0 01/20/2023   HCT 41.6 01/20/2023   MCV 91 01/20/2023   PLT 191 01/20/2023      Component Value Date/Time   NA 142 01/20/2023 1542   K 4.3 01/20/2023 1542   CL 101 01/20/2023 1542   CO2 26 01/20/2023 1542   GLUCOSE 89 01/20/2023 1542   GLUCOSE 125 (H) 12/30/2007 1359   BUN 15 01/20/2023 1542   CREATININE 0.89 01/20/2023 1542   CALCIUM 10.3 01/20/2023 1542   PROT 6.8 01/20/2023 1542   ALBUMIN 4.5 01/20/2023 1542   AST 24 01/20/2023 1542   ALT 24 01/20/2023 1542   ALKPHOS 116 01/20/2023 1542   BILITOT 0.2 01/20/2023 1542   GFRNONAA 67 02/08/2020 1235   GFRAA 78 02/08/2020 1235   Lab Results  Component Value Date   CHOL 207 (H) 04/25/2021   HDL 58 04/25/2021   LDLCALC 124 (H) 04/25/2021   TRIG 142 04/25/2021   CHOLHDL 3.6 04/25/2021   No results found for: "HGBA1C" No results found for: "VITAMINB12" Lab Results  Component Value Date   TSH 5.830 (H) 01/20/2023    Head CT 01/23/2023 No evidence of acute intracranial abnormality.     ASSESSMENT AND PLAN  72 y.o. year old female with history of anxiety/depression, insomnia, migraine who is presenting after an episode of likely vasovagal syncope.  Patient reports prodrome of heart palpitation, increased anxiety prior to the event.  Her  head CT was within normal limits.  No tongue biting, no urinary incontinence, no loss of consciousness. Patient likely had a vasovagal syncope.  Most recent cardiac monitor was also normal.  She does report some increase stress related to the health of her grandchildren.  I will try her on the low-dose of hydroxyzine.  Advised patient to increase her fluid intake and to continue follow-up with PCP return as needed.    1. Syncope, unspecified syncope type   2. Anxiety      Patient Instructions  Continue current medications  Increase fluid intake  Trial of hydroxyzine as needed when are feeling anxious  Return as needed   No orders of the defined types were placed in this encounter.   Meds ordered this encounter  Medications   hydrOXYzine (VISTARIL) 100 MG capsule    Sig: Take 1 capsule (100 mg total) by mouth 3 (three) times daily as needed for itching.    Dispense:  15 capsule    Refill:  0    Return if symptoms worsen or fail to improve.    Alric Ran, MD 02/25/2023, 7:48 AM  [facility] [address] [place], [place] [address] [phone]

## 2023-02-25 NOTE — Patient Instructions (Signed)
Continue current medications  Increase fluid intake  Trial of hydroxyzine as needed when are feeling anxious  Return as needed

## 2023-03-05 ENCOUNTER — Telehealth: Payer: Self-pay | Admitting: Cardiology

## 2023-03-05 MED ORDER — METOPROLOL SUCCINATE ER 25 MG PO TB24: 25.0000 mg | ORAL_TABLET | Freq: Every day | ORAL | 3 refills | Status: DC

## 2023-03-05 NOTE — Telephone Encounter (Signed)
[name], MD 03/01/2023 10:33 PM EDT     Frequent episodes of fast heart rhythm from top chamber of the heart and frequent extra beats.  Recommend starting Toprol-XL 25 mg daily.  Follow-up results of upcoming echocardiogram    Spoke to patient, aware of results and recommendations.   Rx sent to pharmacy.  Echo scheduled 5/9

## 2023-03-05 NOTE — Telephone Encounter (Signed)
Patient is returning RN's call for heart monitor results. Please advise. 

## 2023-03-06 ENCOUNTER — Ambulatory Visit (HOSPITAL_COMMUNITY): Payer: Medicare HMO

## 2023-03-12 DIAGNOSIS — F339 Major depressive disorder, recurrent, unspecified: Secondary | ICD-10-CM | POA: Diagnosis not present

## 2023-03-12 DIAGNOSIS — G4700 Insomnia, unspecified: Secondary | ICD-10-CM | POA: Diagnosis not present

## 2023-03-12 DIAGNOSIS — I471 Supraventricular tachycardia, unspecified: Secondary | ICD-10-CM | POA: Diagnosis not present

## 2023-03-12 DIAGNOSIS — I10 Essential (primary) hypertension: Secondary | ICD-10-CM | POA: Diagnosis not present

## 2023-03-12 DIAGNOSIS — F419 Anxiety disorder, unspecified: Secondary | ICD-10-CM | POA: Diagnosis not present

## 2023-03-12 DIAGNOSIS — E785 Hyperlipidemia, unspecified: Secondary | ICD-10-CM | POA: Diagnosis not present

## 2023-03-12 DIAGNOSIS — L989 Disorder of the skin and subcutaneous tissue, unspecified: Secondary | ICD-10-CM | POA: Diagnosis not present

## 2023-03-12 DIAGNOSIS — G43909 Migraine, unspecified, not intractable, without status migrainosus: Secondary | ICD-10-CM | POA: Diagnosis not present

## 2023-03-28 DIAGNOSIS — R300 Dysuria: Secondary | ICD-10-CM | POA: Diagnosis not present

## 2023-04-03 ENCOUNTER — Ambulatory Visit (HOSPITAL_COMMUNITY): Payer: Medicare HMO

## 2023-04-07 DIAGNOSIS — F332 Major depressive disorder, recurrent severe without psychotic features: Secondary | ICD-10-CM | POA: Diagnosis not present

## 2023-04-07 DIAGNOSIS — F411 Generalized anxiety disorder: Secondary | ICD-10-CM | POA: Diagnosis not present

## 2023-04-07 DIAGNOSIS — Z630 Problems in relationship with spouse or partner: Secondary | ICD-10-CM | POA: Diagnosis not present

## 2023-04-21 NOTE — Progress Notes (Deleted)
Cardiology Office Note:    Date:  04/21/2023   ID:  Sherri Atkins, DOB 08-18-1951, MRN [id number]  PCP:  Laurann Montana, MD  Cardiologist:  [name], MD  Electrophysiologist:  None   Referring MD: Laurann Montana, MD   No chief complaint on file.   History of Present Illness:    Sherri Atkins is a 72 y.o. female with a hx of hypertension, anxiety, depression who presents for follow-up.  She was referred by Dr. Cliffton Asters for evaluation of chest pain, initially seen on 01/10/2020.  She reports that chest pain started in the prior few months.  Describes pressure in center of her chest, occurs when she is stressed.  Also feels short of breath.  Has been occurring a couple times per week.  In addition she has a second type of chest pain that she describes as occasional dull aching pain.  For exercise, she does gardening and will walk for 20 to 40 minutes about twice per week.  She denies any exertional symptoms.  Prescribed omeprazole for GERD, but has not started yet.  Reports BP under good control.  LDL 115 on 12/20/19.  Never smoked.  No family history heart disease.   TTE on 01/10/2020 showed LVEF 60 to 65%, grade 2 diastolic dysfunction, normal RV function, no significant valvular disease.  Cardiac CTA on 02/16/2020 showed calcium score 0.4 (47th percentile), minimal nonobstructive CAD  Also noted to have small lung nodules measuring 4 mm or less.  Zio patch x 14 days on 04/25/2021 showed 8 episodes of SVT, longest lasting 21 seconds with average rate 124 bpm.  Reported syncopal episode at clinic visit 12/2022.  Echocardiogram showed***.  Zio patch x 14 days 01/2023 showed 150 episodes of SVT with longest lasting 16 seconds with average rate 117 bpm, frequent PVCs (7.6% of beats).  Since last clinic visit,  she reports she is having dyspnea on exertion.  States that recently has been having lightheadedness.  Yesterday had syncopal episode.  States that she felt nauseous, neck pain and  stomachache.  She was brushing her teeth, had been standing for about 10 minutes.  After about a minute of symptoms, states that she fell and hit her eye.  She denies any chest pain.  Past Medical History:  Diagnosis Date   Anxiety    Bee sting allergy    Depression    High blood pressure    Migraines     Past Surgical History:  Procedure Laterality Date   FOOT SURGERY  1999    Current Medications: No outpatient medications have been marked as taking for the 04/23/23 encounter (Appointment) with [name], MD.     Allergies:   Other, Doxycycline, Lisinopril, Rexulti [brexpiprazole], Septra [sulfamethoxazole-trimethoprim], and Neosporin [neomycin-bacitracin zn-polymyx]   Social History   Socioeconomic History   Marital status: Married    Spouse name: Not on file   Number of children: Not on file   Years of education: Not on file   Highest education level: Not on file  Occupational History   Not on file  Tobacco Use   Smoking status: Never   Smokeless tobacco: Never  Substance and Sexual Activity   Alcohol use: No   Drug use: No   Sexual activity: Not on file  Other Topics Concern   Not on file  Social History Narrative   Not on file   Social Determinants of Health   Financial Resource Strain: Not on file  Food Insecurity: Not  on file  Transportation Needs: Not on file  Physical Activity: Not on file  Stress: Not on file  Social Connections: Not on file     Family History: The patient's family history includes Asthma in her brother; Constipation in her mother; Dementia in her mother; Depression in her mother, sister, and sister; Epilepsy in her granddaughter; Esophageal cancer in her brother; Hernia in her mother; Lung cancer in her father; Tremor in her grandson.  ROS:   Please see the history of present illness.     All other systems reviewed and are negative.  EKGs/Labs/Other Studies Reviewed:    The following studies were reviewed  today:   EKG:   01/20/23: Inverted T waves in inferior leads, possibly ectopic atrial rhythm, rate 66, Q waves in V1/2 04/25/2021- The EKG ordered demonstrates sinus rhythm, rate 61, LVH, Q waves in lead 1 and lead 2   04/14/2020- The EKG ordered most recently demonstrates normal sinus rhythm, rate 65, no ST/T abnormalities     Recent Labs: 01/20/2023: ALT 24; BUN 15; Creatinine, Ser 0.89; Hemoglobin 14.0; Platelets 191; Potassium 4.3; Sodium 142; TSH 5.830  Recent Lipid Panel    Component Value Date/Time   CHOL 207 (H) 04/25/2021 0944   TRIG 142 04/25/2021 0944   HDL 58 04/25/2021 0944   CHOLHDL 3.6 04/25/2021 0944   LDLCALC 124 (H) 04/25/2021 0944    Physical Exam:    VS:  There were no vitals taken for this visit.    Wt Readings from Last 3 Encounters:  02/24/23 131 lb (59.4 kg)  01/20/23 132 lb (59.9 kg)  04/25/21 131 lb (59.4 kg)     GEN:  Well nourished, well developed in no acute distress HEENT: Normal NECK: No JVD; No carotid bruits CARDIAC: RRR, no murmurs, rubs, gallops RESPIRATORY:  Clear to auscultation without rales, wheezing or rhonchi  ABDOMEN: Soft, non-tender, non-distended MUSCULOSKELETAL:  No edema; No deformity  SKIN: Warm and dry NEUROLOGIC:  Alert and oriented x 3 PSYCHIATRIC:  Normal affect   ASSESSMENT:    No diagnosis found.   PLAN:    Syncope: Unclear etiology, does report some prodromal symptoms so may represent vasovagal syncope.  Zio patch x 14 days 01/2023 showed 150 episodes of SVT with longest lasting 16 seconds with average rate 117 bpm, frequent PVCs (7.6% of beats).  Check echocardiogram to rule out structural heart disease.  Did discuss [facility] regulations that patient should be free of syncope x 6 months to drive  Palpitations: Zio patch x 14 days on 04/25/2021 showed 8 episodes of SVT, longest lasting 21 seconds with average rate 124 bpm. Zio patch x 14 days 01/2023 showed 150 episodes of SVT with longest lasting 16 seconds  with average rate 117 bpm, frequent PVCs (7.6% of beats). -Started Toprol-XL 25 mg daily  Chest pain: Atypical in description.  TTE on 01/10/2020 showed LVEF 60 to 65%, grade 2 diastolic dysfunction, normal RV function, no significant valvular disease.  Cardiac CTA on 02/16/2020 showed calcium score 0.4 (47th percentile), minimal nonobstructive CAD.  No further cardiac work-up recommended  Hypertension: On amlodipine 5 mg daily and hydrochlorothiazide 12.5 mg daily and Toprol-XL 25 mg daily.  Appears well controlled.  Will check BMP, magnesium  Pulmonary nodules: Scattered subpleural nodules measuring 4 mm or less on coronary CTA on 02/16/2020.  Can consider repeat CT chest at 12 months, though as patient is low risk (no smoking history, no prior history of malignancy), no follow-up is needed  Hyperlipidemia: Will check lipid panel***  RTC in***  Medication Adjustments/Labs and Tests Ordered: Current medicines are reviewed at length with the patient today.  Concerns regarding medicines are outlined above.  No orders of the defined types were placed in this encounter.  No orders of the defined types were placed in this encounter.   There are no Patient Instructions on file for this visit.     Signed, [name], MD  04/21/2023 10:46 PM    [facility]

## 2023-04-22 ENCOUNTER — Ambulatory Visit (HOSPITAL_COMMUNITY): Payer: Medicare HMO | Attending: Internal Medicine

## 2023-04-22 DIAGNOSIS — R55 Syncope and collapse: Secondary | ICD-10-CM | POA: Diagnosis not present

## 2023-04-22 LAB — ECHOCARDIOGRAM COMPLETE
Area-P 1/2: 3.46 cm2
S' Lateral: 2.5 cm

## 2023-04-23 ENCOUNTER — Ambulatory Visit: Payer: Medicare HMO | Admitting: Cardiology

## 2023-05-01 ENCOUNTER — Telehealth: Payer: Self-pay

## 2023-05-01 ENCOUNTER — Other Ambulatory Visit: Payer: Self-pay

## 2023-05-01 ENCOUNTER — Telehealth: Payer: Self-pay | Admitting: Cardiology

## 2023-05-01 DIAGNOSIS — R079 Chest pain, unspecified: Secondary | ICD-10-CM

## 2023-05-01 DIAGNOSIS — Z01812 Encounter for preprocedural laboratory examination: Secondary | ICD-10-CM

## 2023-05-01 NOTE — Telephone Encounter (Signed)
Already spoke to patient echo results given. °

## 2023-05-01 NOTE — Telephone Encounter (Signed)
Your physician has requested that you have a cardiac MRI. Cardiac MRI uses a computer to create images of your heart as its beating, producing both still and moving pictures of your heart and major blood vessels. For further information please visit [email or web address].   

## 2023-05-01 NOTE — Telephone Encounter (Signed)
Patient is returning call. Please advise? 

## 2023-05-02 DIAGNOSIS — R079 Chest pain, unspecified: Secondary | ICD-10-CM | POA: Diagnosis not present

## 2023-05-02 DIAGNOSIS — Z01812 Encounter for preprocedural laboratory examination: Secondary | ICD-10-CM | POA: Diagnosis not present

## 2023-05-03 LAB — CBC WITH DIFFERENTIAL/PLATELET
Basophils Absolute: 0 10*3/uL (ref 0.0–0.2)
Basos: 1 %
EOS (ABSOLUTE): 0.2 10*3/uL (ref 0.0–0.4)
Eos: 5 %
Hematocrit: 40.2 % (ref 34.0–46.6)
Hemoglobin: 13.5 g/dL (ref 11.1–15.9)
Immature Grans (Abs): 0 10*3/uL (ref 0.0–0.1)
Immature Granulocytes: 0 %
Lymphocytes Absolute: 1.5 10*3/uL (ref 0.7–3.1)
Lymphs: 28 %
MCH: 30 pg (ref 26.6–33.0)
MCHC: 33.6 g/dL (ref 31.5–35.7)
MCV: 89 fL (ref 79–97)
Monocytes Absolute: 0.5 10*3/uL (ref 0.1–0.9)
Monocytes: 10 %
Neutrophils Absolute: 2.9 10*3/uL (ref 1.4–7.0)
Neutrophils: 56 %
Platelets: 180 10*3/uL (ref 150–450)
RBC: 4.5 x10E6/uL (ref 3.77–5.28)
RDW: 12.3 % (ref 11.7–15.4)
WBC: 5.2 10*3/uL (ref 3.4–10.8)

## 2023-05-03 LAB — BASIC METABOLIC PANEL
BUN/Creatinine Ratio: 21 (ref 12–28)
BUN: 17 mg/dL (ref 8–27)
CO2: 28 mmol/L (ref 20–29)
Calcium: 9.7 mg/dL (ref 8.7–10.3)
Chloride: 99 mmol/L (ref 96–106)
Creatinine, Ser: 0.81 mg/dL (ref 0.57–1.00)
Glucose: 86 mg/dL (ref 70–99)
Potassium: 4.7 mmol/L (ref 3.5–5.2)
Sodium: 137 mmol/L (ref 134–144)
eGFR: 78 mL/min/{1.73_m2} (ref >59)

## 2023-05-07 ENCOUNTER — Other Ambulatory Visit: Payer: Self-pay

## 2023-05-07 DIAGNOSIS — Z01812 Encounter for preprocedural laboratory examination: Secondary | ICD-10-CM

## 2023-05-07 DIAGNOSIS — R079 Chest pain, unspecified: Secondary | ICD-10-CM

## 2023-05-10 IMAGING — MG MM DIGITAL SCREENING BILAT W/ TOMO AND CAD
8 series · 9 of 24 positions shown · non-contrast
Comparison: Previous exam(s).

CLINICAL DATA: Screening.

EXAM:
DIGITAL SCREENING BILATERAL MAMMOGRAM WITH TOMOSYNTHESIS AND CAD
TECHNIQUE: Bilateral screening digital craniocaudal and mediolateral oblique
mammograms were obtained. Bilateral screening digital breast
tomosynthesis was performed. The images were evaluated with
computer-aided detection.

[L CC synth-2D]
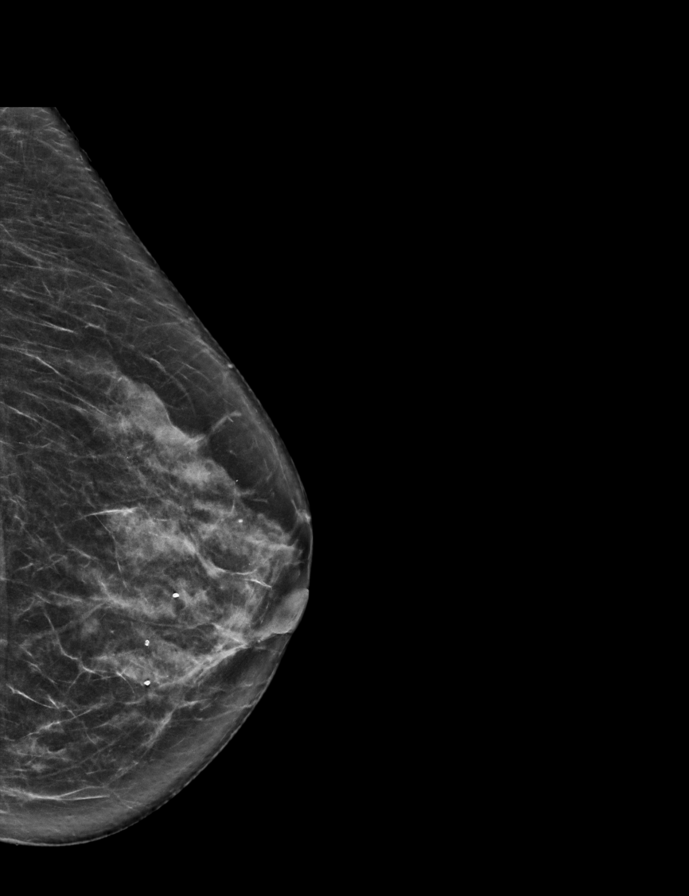

[R MLO synth-2D]
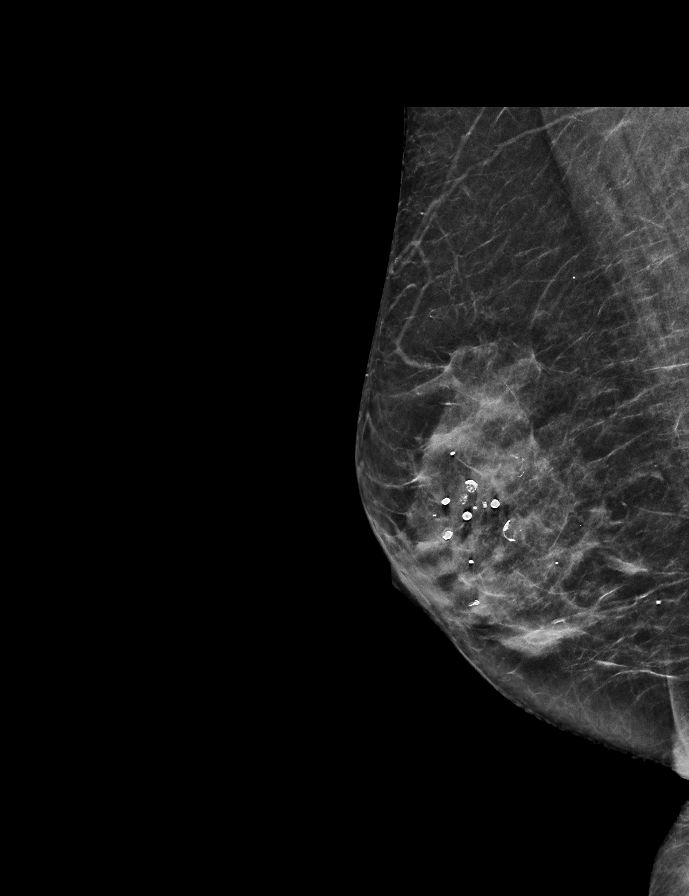

[L MLO synth-2D]
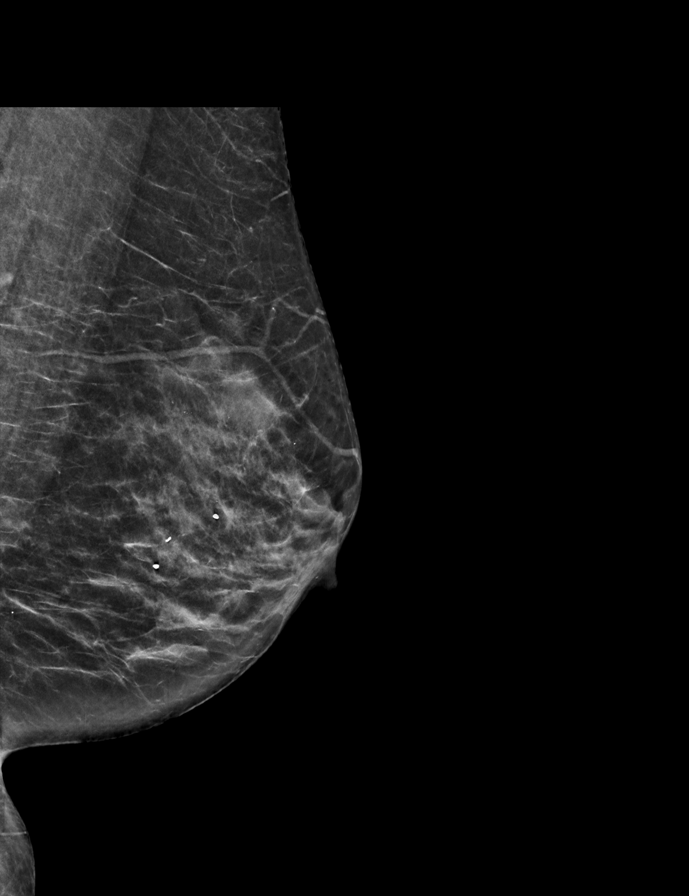

[R CC synth-2D]
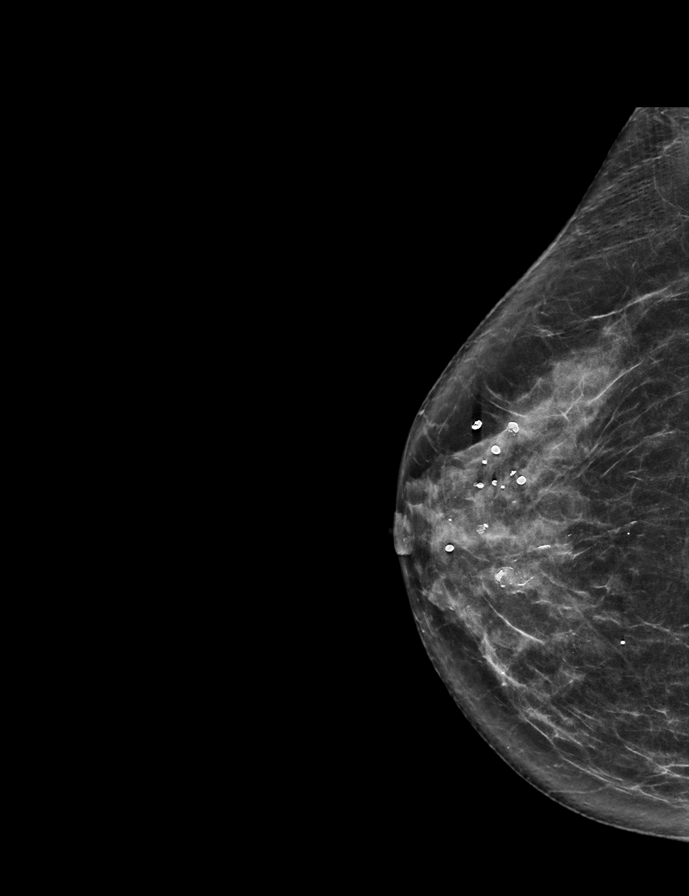

[R MLO tomo · 2 of 58 frames shown]
[frame 19/58]
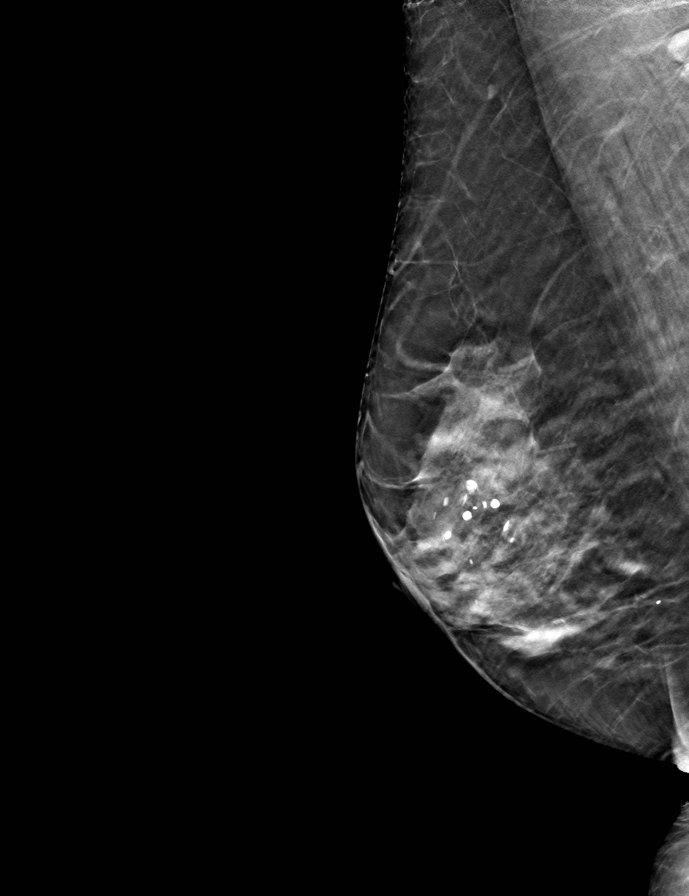
[frame 29/58]
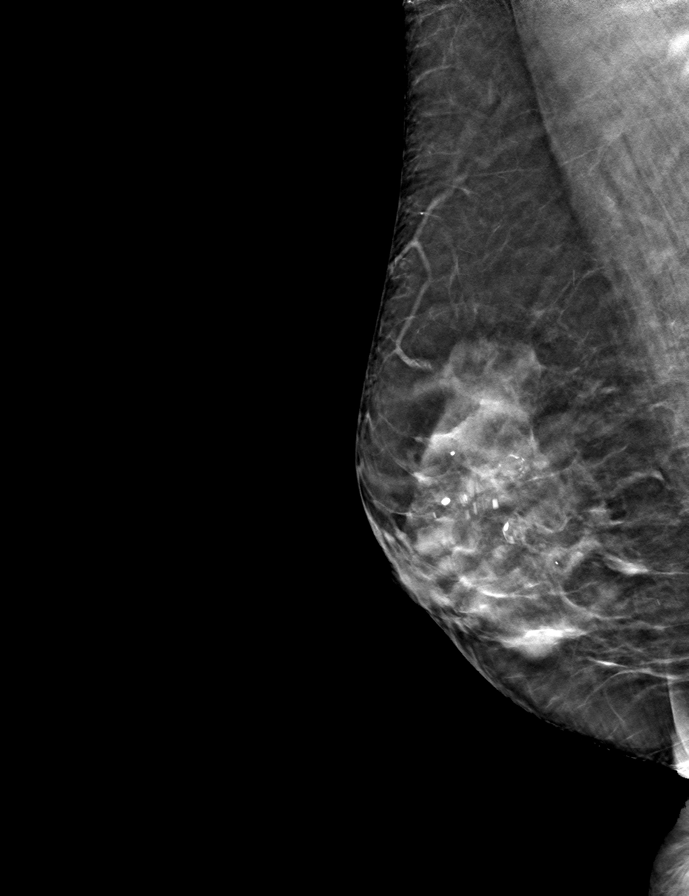

[R CC tomo · tomo slice 29/58.0]
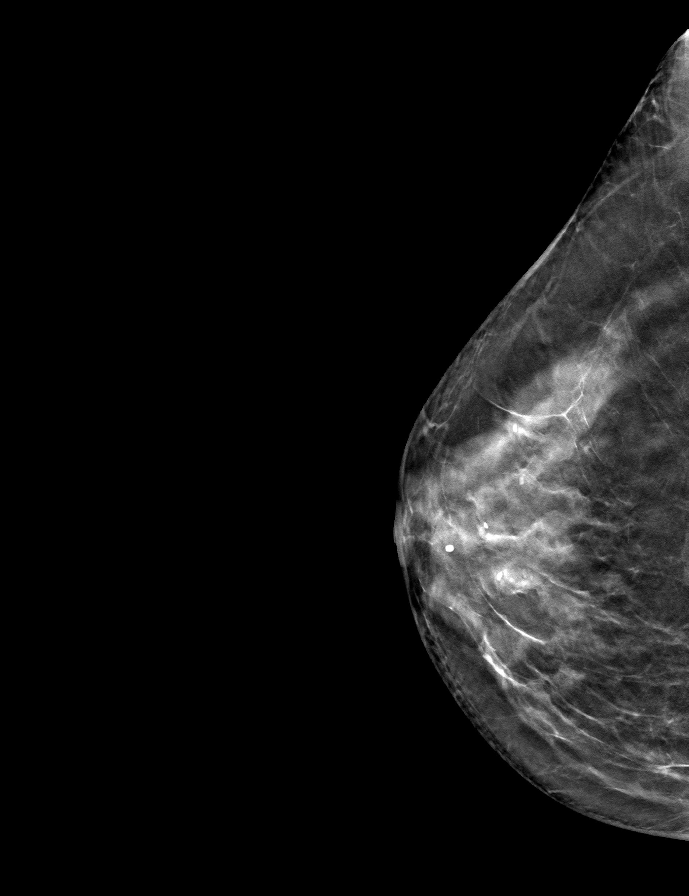

[L CC tomo · tomo slice 29/58.0]
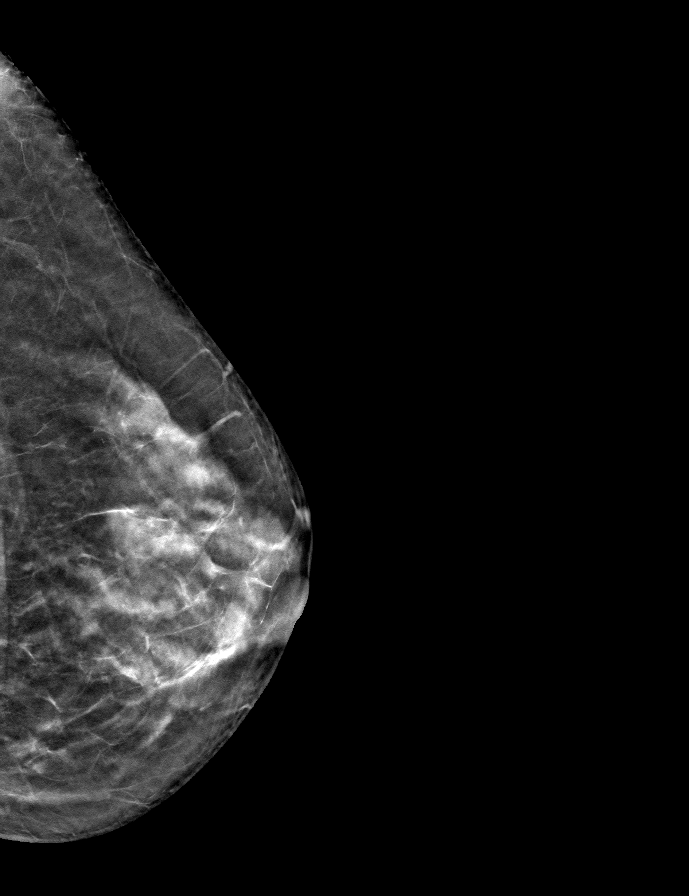

[L MLO tomo · tomo slice 29/56.0]
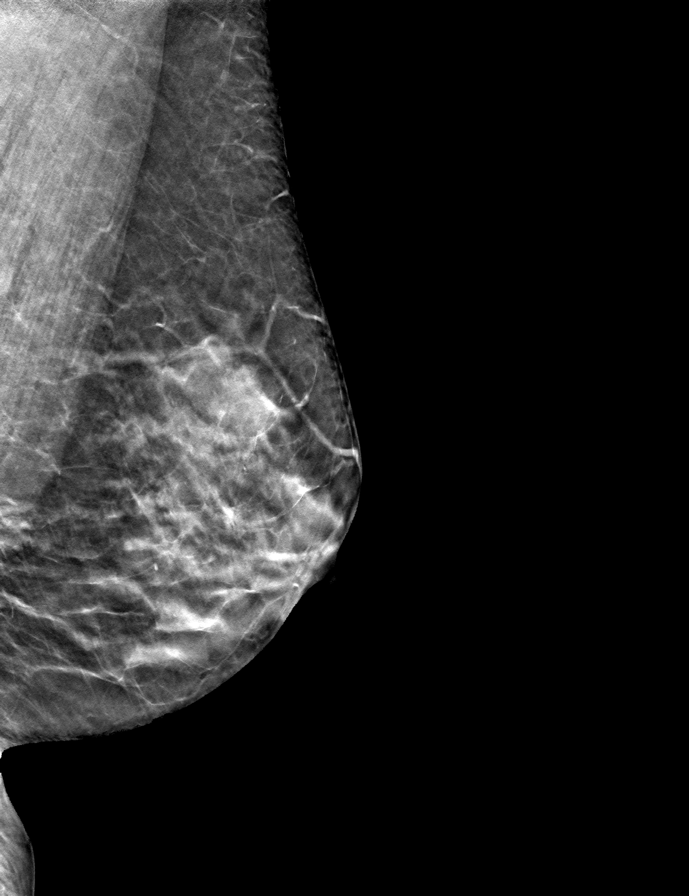

[9 of 24 positions shown; findings below may reference images not displayed]

ACR Breast Density Category c: The breast tissue is heterogeneously
dense, which may obscure small masses.
FINDINGS: There are no findings suspicious for malignancy.
IMPRESSION: No mammographic evidence of malignancy. A result letter of this
screening mammogram will be mailed directly to the patient.

RECOMMENDATION:
Screening mammogram in one year. (Code:Q3-W-BC3)

BI-RADS CATEGORY  1: Negative.

## 2023-06-13 ENCOUNTER — Other Ambulatory Visit: Payer: Self-pay

## 2023-06-13 MED ORDER — METOPROLOL SUCCINATE ER 25 MG PO TB24: 25.0000 mg | ORAL_TABLET | Freq: Every day | ORAL | 2 refills | Status: DC

## 2023-06-23 DIAGNOSIS — G5603 Carpal tunnel syndrome, bilateral upper limbs: Secondary | ICD-10-CM | POA: Diagnosis not present

## 2023-06-23 DIAGNOSIS — M180 Bilateral primary osteoarthritis of first carpometacarpal joints: Secondary | ICD-10-CM | POA: Diagnosis not present

## 2023-06-30 DIAGNOSIS — Z01812 Encounter for preprocedural laboratory examination: Secondary | ICD-10-CM | POA: Diagnosis not present

## 2023-06-30 DIAGNOSIS — R079 Chest pain, unspecified: Secondary | ICD-10-CM | POA: Diagnosis not present

## 2023-07-10 DIAGNOSIS — Z630 Problems in relationship with spouse or partner: Secondary | ICD-10-CM | POA: Diagnosis not present

## 2023-07-10 DIAGNOSIS — F332 Major depressive disorder, recurrent severe without psychotic features: Secondary | ICD-10-CM | POA: Diagnosis not present

## 2023-07-10 DIAGNOSIS — F411 Generalized anxiety disorder: Secondary | ICD-10-CM | POA: Diagnosis not present

## 2023-07-16 ENCOUNTER — Ambulatory Visit: Payer: Medicare HMO | Admitting: Cardiology

## 2023-07-21 DIAGNOSIS — M19041 Primary osteoarthritis, right hand: Secondary | ICD-10-CM | POA: Diagnosis not present

## 2023-07-21 DIAGNOSIS — M19042 Primary osteoarthritis, left hand: Secondary | ICD-10-CM | POA: Diagnosis not present

## 2023-07-22 ENCOUNTER — Telehealth (HOSPITAL_COMMUNITY): Payer: Self-pay | Admitting: *Deleted

## 2023-07-22 NOTE — Telephone Encounter (Signed)
Patient calling about her upcoming cardiac imaging study; pt verbalizes understanding of appt date/time, parking situation and where to check in, and verified current allergies; name and call back number provided for further questions should they arise  Larey Brick RN Navigator Cardiac Imaging [facility] [phone] office [phone] cell  Patient denies metal but does report claustrophobia. She thinks she can tolerate an MRI. I encouraged her to speak with Dr. Campbell Lerner office if she need anything for claustrophobia. She verbalized understanding that will need a driver if she takes medication for her MRI.

## 2023-07-24 ENCOUNTER — Other Ambulatory Visit: Payer: Self-pay | Admitting: Cardiology

## 2023-07-24 ENCOUNTER — Ambulatory Visit (HOSPITAL_COMMUNITY)
Admission: RE | Admit: 2023-07-24 | Discharge: 2023-07-24 | Disposition: A | Discharge status: Home / Self Care | Payer: Medicare HMO | Source: Ambulatory Visit | Attending: Cardiology | Admitting: Cardiology

## 2023-07-24 DIAGNOSIS — Z01812 Encounter for preprocedural laboratory examination: Secondary | ICD-10-CM

## 2023-07-24 DIAGNOSIS — R079 Chest pain, unspecified: Secondary | ICD-10-CM

## 2023-07-24 MED ORDER — GADOBUTROL 1 MMOL/ML IV SOLN
9.0000 mL | Freq: Once | INTRAVENOUS | Status: AC | PRN
  Administered 2023-07-24: 9 mL via INTRAVENOUS

## 2023-07-28 NOTE — Progress Notes (Unsigned)
Cardiology Office Note:    Date:  07/29/2023   ID:  Sherri Atkins, DOB 03-19-1951, MRN [id number]  PCP:  Laurann Montana, MD  Cardiologist:  [name], MD  Electrophysiologist:  None   Referring MD: Laurann Montana, MD   Chief Complaint  Patient presents with   Loss of Consciousness    History of Present Illness:    Sherri Atkins is a 72 y.o. female with a hx of hypertension, anxiety, depression who presents for follow-up.  She was referred by Dr. Cliffton Asters for evaluation of chest pain, initially seen on 01/10/2020.  She reports that chest pain started in the prior few months.  Describes pressure in center of her chest, occurs when she is stressed.  Also feels short of breath.  Has been occurring a couple times per week.  In addition she has a second type of chest pain that she describes as occasional dull aching pain.  For exercise, she does gardening and will walk for 20 to 40 minutes about twice per week.  She denies any exertional symptoms.  Prescribed omeprazole for GERD, but has not started yet.  Reports BP under good control.  LDL 115 on 12/20/19.  Never smoked.  No family history heart disease.   TTE on 01/10/2020 showed LVEF 60 to 65%, grade 2 diastolic dysfunction, normal RV function, no significant valvular disease.  Cardiac CTA on 02/16/2020 showed calcium score 0.4 (47th percentile), minimal nonobstructive CAD  Also noted to have small lung nodules measuring 4 mm or less.  Zio patch x 14 days on 04/25/2021 showed 8 episodes of SVT, longest lasting 21 seconds with average rate 124 bpm.  She reported syncopal episode 12/2022.  Zio patch x 14 days 01/2023 showed 150 episodes of SVT with longest lasting 16 seconds, frequent PVCs (7.6% of beats).  Echocardiogram 04/22/2023 showed EF 60 to 65%, moderate basal septal LVH, grade 1 diastolic dysfunction, normal RV function, no significant valvular disease.  Cardiac MRI 07/24/2023 showed normal LV wall thickness, normal biventricular  systolic function, no LGE.  Since last clinic visit, she reports she is doing well.  States that palpitations have resolved.  Denies any recent lightheadedness or syncope.  She denies any chest pain, dyspnea, lower extremity edema.  Reports for exercise she gardens frequently.  Past Medical History:  Diagnosis Date   Anxiety    Bee sting allergy    Depression    High blood pressure    Migraines     Past Surgical History:  Procedure Laterality Date   FOOT SURGERY  1999    Current Medications: Current Meds  Medication Sig   amLODipine (NORVASC) 5 MG tablet Take 5 mg by mouth daily.   CALCIUM PO Take 1 tablet by mouth.   celecoxib (CELEBREX) 200 MG capsule Take by mouth.   EPINEPHrine 0.3 mg/0.3 mL IJ SOAJ injection Inject 0.3 mg into the muscle once.   ergocalciferol (VITAMIN D2) 1.25 MG (50000 UT) capsule Take by mouth.   hydrochlorothiazide (MICROZIDE) 12.5 MG capsule Take 12.5 mg by mouth daily.   metoprolol succinate (TOPROL XL) 25 MG 24 hr tablet Take 1 tablet (25 mg total) by mouth daily.   Multiple Vitamins-Minerals (ZINC PO) Take 1 tablet by mouth daily.   nitroGLYCERIN (NITROSTAT) 0.4 MG SL tablet    Omega-3 Fatty Acids (FISH OIL PO) Take 1 capsule by mouth daily.   rizatriptan (MAXALT) 10 MG tablet Take 10 mg by mouth as needed for migraine. May repeat in 2 hours  if needed   sertraline (ZOLOFT) 100 MG tablet Take 150 mg by mouth daily.    traZODone (DESYREL) 25 mg TABS tablet Take 25 mg by mouth at bedtime.     Allergies:   Other, Doxycycline, Lisinopril, Rexulti [brexpiprazole], Septra [sulfamethoxazole-trimethoprim], and Neosporin [neomycin-bacitracin zn-polymyx]   Social History   Socioeconomic History   Marital status: Married    Spouse name: Not on file   Number of children: Not on file   Years of education: Not on file   Highest education level: Not on file  Occupational History   Not on file  Tobacco Use   Smoking status: Never   Smokeless tobacco:  Never  Substance and Sexual Activity   Alcohol use: No   Drug use: No   Sexual activity: Not on file  Other Topics Concern   Not on file  Social History Narrative   Not on file   Social Determinants of Health   Financial Resource Strain: Not on file  Food Insecurity: Not on file  Transportation Needs: Not on file  Physical Activity: Not on file  Stress: Not on file  Social Connections: Not on file     Family History: The patient's family history includes Asthma in her brother; Constipation in her mother; Dementia in her mother; Depression in her mother, sister, and sister; Epilepsy in her granddaughter; Esophageal cancer in her brother; Hernia in her mother; Lung cancer in her father; Tremor in her grandson.  ROS:   Please see the history of present illness.     All other systems reviewed and are negative.  EKGs/Labs/Other Studies Reviewed:    The following studies were reviewed today:   EKG:   07/29/23: Sinus bradycardia, rate 49, Q waves in V1-2 01/20/23: Inverted T waves in inferior leads, possibly ectopic atrial rhythm, rate 66, Q waves in V1/2 04/25/2021- The EKG ordered demonstrates sinus rhythm, rate 61, LVH, Q waves in lead 1 and lead 2   04/14/2020- The EKG ordered most recently demonstrates normal sinus rhythm, rate 65, no ST/T abnormalities     Recent Labs: 01/20/2023: ALT 24; TSH 5.830 06/30/2023: BUN 15; Creatinine, Ser 0.88; Hemoglobin 13.1; Platelets 202; Potassium 4.5; Sodium 143  Recent Lipid Panel    Component Value Date/Time   CHOL 207 (H) 04/25/2021 0944   TRIG 142 04/25/2021 0944   HDL 58 04/25/2021 0944   CHOLHDL 3.6 04/25/2021 0944   LDLCALC 124 (H) 04/25/2021 0944    Physical Exam:    VS:  BP 110/60 (BP Location: Left Arm, Patient Position: Sitting, Cuff Size: Normal)   Pulse (!) 54   Ht 5\' 2"  (1.575 m)   Wt 137 lb 9.6 oz (62.4 kg)   SpO2 98%   BMI 25.17 kg/m     Wt Readings from Last 3 Encounters:  07/29/23 137 lb 9.6 oz (62.4 kg)   02/24/23 131 lb (59.4 kg)  01/20/23 132 lb (59.9 kg)     GEN:  Well nourished, well developed in no acute distress HEENT: Normal NECK: No JVD; No carotid bruits CARDIAC: RRR, no murmurs, rubs, gallops RESPIRATORY:  Clear to auscultation without rales, wheezing or rhonchi  ABDOMEN: Soft, non-tender, non-distended MUSCULOSKELETAL:  No edema; No deformity  SKIN: Warm and dry NEUROLOGIC:  Alert and oriented x 3 PSYCHIATRIC:  Normal affect   ASSESSMENT:    1. Palpitations   2. SVT (supraventricular tachycardia)   3. PVC (premature ventricular contraction)   4. Syncope, unspecified syncope type   5. Essential hypertension  6. Hyperlipidemia, unspecified hyperlipidemia type      PLAN:    Syncope: Unclear etiology, does report some prodromal symptoms so may represent vasovagal syncope.  Zio patch x 14 days 01/2023 showed 150 episodes of SVT with longest lasting 16 seconds, frequent PVCs (7.6% of beats).  Echocardiogram 04/22/2023 showed EF 60 to 65%, moderate basal septal LVH, grade 1 diastolic dysfunction, normal RV function, no significant valvular disease.  Cardiac MRI 07/24/2023 showed normal LV wall thickness, normal biventricular systolic function, no LGE. -Reports no recent lightheadedness or syncope  Palpitations/SVT/PVCs: Zio patch x 14 days on 04/25/2021 showed 8 episodes of SVT, longest lasting 21 seconds with average rate 124 bpm, no symptoms reported during SVT episodes.  Zio patch x 14 days 01/2023 showed 150 episodes of SVT with longest lasting 16 seconds, frequent PVCs (7.6% of beats). -Started Toprol-XL 25 mg daily.  Reports palpitations have resolved but low resting heart rate, 49 bpm in clinic today.  Denies any symptoms.  Check Zio patch x 3 days to evaluate for improvement in NSVT/PVCs as well as monitor heart rate to ensure not dropping too low  Chest pain: Atypical in description.  TTE on 01/10/2020 showed LVEF 60 to 65%, grade 2 diastolic dysfunction, normal RV function,  no significant valvular disease.  Cardiac CTA on 02/16/2020 showed calcium score 0.4 (47th percentile), minimal nonobstructive CAD.  No further cardiac work-up recommended  Hypertension: On amlodipine 2.5 mg daily and hydrochlorothiazide 12.5 mg daily and Toprol-XL 25 mg daily.  Appears well controlled.    Pulmonary nodules: Scattered subpleural nodules measuring 4 mm or less on coronary CTA on 02/16/2020.  As patient is low risk (no smoking history, no prior history of malignancy), no follow-up is needed  Hyperlipidemia: On rosuvastatin 10 mg daily.  LDL 71 on 09/05/22  RTC in 6 months  Medication Adjustments/Labs and Tests Ordered: Current medicines are reviewed at length with the patient today.  Concerns regarding medicines are outlined above.  Orders Placed This Encounter  Procedures   LONG TERM MONITOR (3-14 DAYS)   EKG 12-Lead   No orders of the defined types were placed in this encounter.   Patient Instructions  Medication Instructions:  Your physician recommends that you continue on your current medications as directed. Please refer to the Current Medication list given to you today.    *If you need a refill on your cardiac medications before your next appointment, please call your pharmacy*     Testing/Procedures: ZIO XT- Long Term Monitor Instructions  Your physician has requested you wear a ZIO patch monitor for 3 days.  This is a single patch monitor. [employer] supplies one patch monitor per enrollment. Additional stickers are not available. Please do not apply patch if you will be having a Nuclear Stress Test,  Echocardiogram, Cardiac CT, MRI, or Chest Xray during the period you would be wearing the  monitor. The patch cannot be worn during these tests. You cannot remove and re-apply the  ZIO XT patch monitor.  Your ZIO patch monitor will be mailed 3 day USPS to your address on file. It may take 3-5 days  to receive your monitor after you have been enrolled.  Once you  have received your monitor, please review the enclosed instructions. Your monitor  has already been registered assigning a specific monitor serial # to you.  Billing and Patient Assistance Program Information  We have supplied [employer] with any of your insurance information on file for billing purposes. [employer] offers a sliding  scale Patient Assistance Program for patients that do not have  insurance, or whose insurance does not completely cover the cost of the ZIO monitor.  You must apply for the Patient Assistance Program to qualify for this discounted rate.  To apply, please call [employer] at [phone], select option 4, select option 2, ask to apply for  Patient Assistance Program. Meredeth Ide will ask your household income, and how many people  are in your household. They will quote your out-of-pocket cost based on that information.  [employer] will also be able to set up a 38-month, interest-free payment plan if needed.  Applying the monitor   Shave hair from upper left chest.  Hold abrader disc by orange tab. Rub abrader in 40 strokes over the upper left chest as  indicated in your monitor instructions.  Clean area with 4 enclosed alcohol pads. Let dry.  Apply patch as indicated in monitor instructions. Patch will be placed under collarbone on left  side of chest with arrow pointing upward.  Rub patch adhesive wings for 2 minutes. Remove white label marked "1". Remove the white  label marked "2". Rub patch adhesive wings for 2 additional minutes.  While looking in a mirror, press and release button in center of patch. A small green light will  flash 3-4 times. This will be your only indicator that the monitor has been turned on.  Do not shower for the first 24 hours. You may shower after the first 24 hours.  Press the button if you feel a symptom. You will hear a small click. Record Date, Time and  Symptom in the Patient Logbook.  When you are ready to remove the patch, follow  instructions on the last 2 pages of Patient  Logbook. Stick patch monitor onto the last page of Patient Logbook.  Place Patient Logbook in the blue and white box. Use locking tab on box and tape box closed  securely. The blue and white box has prepaid postage on it. Please place it in the mailbox as  soon as possible. Your physician should have your test results approximately 7 days after the  monitor has been mailed back to [facility].  Call [facility] Customer Care at [phone] if you have questions regarding  your ZIO XT patch monitor. Call them immediately if you see an orange light blinking on your  monitor.  If your monitor falls off in less than 4 days, contact our Monitor department at [phone].  If your monitor becomes loose or falls off after 4 days call [employer] at [phone] for  suggestions on securing your monitor    Follow-Up: At [facility], you and your health needs are our priority.  As part of our continuing mission to provide you with exceptional heart care, we have created designated Provider Care Teams.  These Care Teams include your primary Cardiologist (physician) and Advanced Practice Providers (APPs -  Physician Assistants and Nurse Practitioners) who all work together to provide you with the care you need, when you need it.  We recommend signing up for the patient portal called "MyChart".  Sign up information is provided on this After Visit Summary.  MyChart is used to connect with patients for Virtual Visits (Telemedicine).  Patients are able to view lab/test results, encounter notes, upcoming appointments, etc.  Non-urgent messages can be sent to your provider as well.   To learn more about what you can do with MyChart, go to [email or web address].    Your next appointment:   6  month(s)  The format for your next appointment:   In Person  Provider:   [name], MD        Signed, [name], MD  07/29/2023  9:32 AM    [facility]

## 2023-07-29 ENCOUNTER — Ambulatory Visit: Payer: Medicare HMO | Attending: Cardiology | Admitting: Cardiology

## 2023-07-29 ENCOUNTER — Ambulatory Visit: Payer: Medicare HMO | Attending: Cardiology

## 2023-07-29 ENCOUNTER — Encounter: Payer: Self-pay | Admitting: Cardiology

## 2023-07-29 VITALS — BP 110/60 | HR 54 | Ht 62.0 in | Wt 137.6 lb

## 2023-07-29 DIAGNOSIS — E785 Hyperlipidemia, unspecified: Secondary | ICD-10-CM | POA: Diagnosis not present

## 2023-07-29 DIAGNOSIS — I471 Supraventricular tachycardia, unspecified: Secondary | ICD-10-CM | POA: Diagnosis not present

## 2023-07-29 DIAGNOSIS — I493 Ventricular premature depolarization: Secondary | ICD-10-CM

## 2023-07-29 DIAGNOSIS — R002 Palpitations: Secondary | ICD-10-CM | POA: Diagnosis not present

## 2023-07-29 DIAGNOSIS — R55 Syncope and collapse: Secondary | ICD-10-CM

## 2023-07-29 DIAGNOSIS — I10 Essential (primary) hypertension: Secondary | ICD-10-CM | POA: Diagnosis not present

## 2023-07-29 NOTE — Addendum Note (Signed)
Addended by: Shade Flood R on: 07/29/2023 10:01 AM   Modules accepted: Orders

## 2023-07-29 NOTE — Progress Notes (Unsigned)
Enrolled for [employer] to mail a ZIO XT long term holter monitor to the patients address on file.  

## 2023-07-29 NOTE — Patient Instructions (Signed)
Medication Instructions:  Your physician recommends that you continue on your current medications as directed. Please refer to the Current Medication list given to you today.    *If you need a refill on your cardiac medications before your next appointment, please call your pharmacy*     Testing/Procedures: ZIO XT- Long Term Monitor Instructions  Your physician has requested you wear a ZIO patch monitor for 3 days.  This is a single patch monitor. [employer] supplies one patch monitor per enrollment. Additional stickers are not available. Please do not apply patch if you will be having a Nuclear Stress Test,  Echocardiogram, Cardiac CT, MRI, or Chest Xray during the period you would be wearing the  monitor. The patch cannot be worn during these tests. You cannot remove and re-apply the  ZIO XT patch monitor.  Your ZIO patch monitor will be mailed 3 day USPS to your address on file. It may take 3-5 days  to receive your monitor after you have been enrolled.  Once you have received your monitor, please review the enclosed instructions. Your monitor  has already been registered assigning a specific monitor serial # to you.  Billing and Patient Assistance Program Information  We have supplied [employer] with any of your insurance information on file for billing purposes. [employer] offers a sliding scale Patient Assistance Program for patients that do not have  insurance, or whose insurance does not completely cover the cost of the ZIO monitor.  You must apply for the Patient Assistance Program to qualify for this discounted rate.  To apply, please call [employer] at [phone], select option 4, select option 2, ask to apply for  Patient Assistance Program. Meredeth Ide will ask your household income, and how many people  are in your household. They will quote your out-of-pocket cost based on that information.  [employer] will also be able to set up a 71-month, interest-free payment plan if  needed.  Applying the monitor   Shave hair from upper left chest.  Hold abrader disc by orange tab. Rub abrader in 40 strokes over the upper left chest as  indicated in your monitor instructions.  Clean area with 4 enclosed alcohol pads. Let dry.  Apply patch as indicated in monitor instructions. Patch will be placed under collarbone on left  side of chest with arrow pointing upward.  Rub patch adhesive wings for 2 minutes. Remove white label marked "1". Remove the white  label marked "2". Rub patch adhesive wings for 2 additional minutes.  While looking in a mirror, press and release button in center of patch. A small green light will  flash 3-4 times. This will be your only indicator that the monitor has been turned on.  Do not shower for the first 24 hours. You may shower after the first 24 hours.  Press the button if you feel a symptom. You will hear a small click. Record Date, Time and  Symptom in the Patient Logbook.  When you are ready to remove the patch, follow instructions on the last 2 pages of Patient  Logbook. Stick patch monitor onto the last page of Patient Logbook.  Place Patient Logbook in the blue and white box. Use locking tab on box and tape box closed  securely. The blue and white box has prepaid postage on it. Please place it in the mailbox as  soon as possible. Your physician should have your test results approximately 7 days after the  monitor has been mailed back to [facility].  Call  [employer] Customer Care at [phone] if you have questions regarding  your ZIO XT patch monitor. Call them immediately if you see an orange light blinking on your  monitor.  If your monitor falls off in less than 4 days, contact our Monitor department at [phone].  If your monitor becomes loose or falls off after 4 days call [employer] at [phone] for  suggestions on securing your monitor    Follow-Up: At [facility], you and your health needs are our  priority.  As part of our continuing mission to provide you with exceptional heart care, we have created designated Provider Care Teams.  These Care Teams include your primary Cardiologist (physician) and Advanced Practice Providers (APPs -  Physician Assistants and Nurse Practitioners) who all work together to provide you with the care you need, when you need it.  We recommend signing up for the patient portal called "MyChart".  Sign up information is provided on this After Visit Summary.  MyChart is used to connect with patients for Virtual Visits (Telemedicine).  Patients are able to view lab/test results, encounter notes, upcoming appointments, etc.  Non-urgent messages can be sent to your provider as well.   To learn more about what you can do with MyChart, go to [email or web address].    Your next appointment:   6 month(s)  The format for your next appointment:   In Person  Provider:   [name], MD

## 2023-08-04 DIAGNOSIS — I471 Supraventricular tachycardia, unspecified: Secondary | ICD-10-CM | POA: Diagnosis not present

## 2023-08-04 DIAGNOSIS — R002 Palpitations: Secondary | ICD-10-CM

## 2023-08-04 DIAGNOSIS — I493 Ventricular premature depolarization: Secondary | ICD-10-CM | POA: Diagnosis not present

## 2023-08-13 DIAGNOSIS — R002 Palpitations: Secondary | ICD-10-CM | POA: Diagnosis not present

## 2023-08-13 DIAGNOSIS — I493 Ventricular premature depolarization: Secondary | ICD-10-CM | POA: Diagnosis not present

## 2023-08-21 DIAGNOSIS — R300 Dysuria: Secondary | ICD-10-CM | POA: Diagnosis not present

## 2023-08-21 DIAGNOSIS — R3915 Urgency of urination: Secondary | ICD-10-CM | POA: Diagnosis not present

## 2023-09-08 DIAGNOSIS — D485 Neoplasm of uncertain behavior of skin: Secondary | ICD-10-CM | POA: Diagnosis not present

## 2023-09-08 DIAGNOSIS — D1801 Hemangioma of skin and subcutaneous tissue: Secondary | ICD-10-CM | POA: Diagnosis not present

## 2023-09-08 DIAGNOSIS — L814 Other melanin hyperpigmentation: Secondary | ICD-10-CM | POA: Diagnosis not present

## 2023-09-08 DIAGNOSIS — C44311 Basal cell carcinoma of skin of nose: Secondary | ICD-10-CM | POA: Diagnosis not present

## 2023-09-08 DIAGNOSIS — L821 Other seborrheic keratosis: Secondary | ICD-10-CM | POA: Diagnosis not present

## 2023-09-08 DIAGNOSIS — L820 Inflamed seborrheic keratosis: Secondary | ICD-10-CM | POA: Diagnosis not present

## 2023-09-08 DIAGNOSIS — D225 Melanocytic nevi of trunk: Secondary | ICD-10-CM | POA: Diagnosis not present

## 2023-09-23 ENCOUNTER — Other Ambulatory Visit: Payer: Self-pay | Admitting: Family Medicine

## 2023-09-23 DIAGNOSIS — G4700 Insomnia, unspecified: Secondary | ICD-10-CM | POA: Diagnosis not present

## 2023-09-23 DIAGNOSIS — I10 Essential (primary) hypertension: Secondary | ICD-10-CM | POA: Diagnosis not present

## 2023-09-23 DIAGNOSIS — F339 Major depressive disorder, recurrent, unspecified: Secondary | ICD-10-CM | POA: Diagnosis not present

## 2023-09-23 DIAGNOSIS — Z23 Encounter for immunization: Secondary | ICD-10-CM | POA: Diagnosis not present

## 2023-09-23 DIAGNOSIS — M8580 Other specified disorders of bone density and structure, unspecified site: Secondary | ICD-10-CM

## 2023-09-23 DIAGNOSIS — E538 Deficiency of other specified B group vitamins: Secondary | ICD-10-CM | POA: Diagnosis not present

## 2023-09-23 DIAGNOSIS — Z0000 Encounter for general adult medical examination without abnormal findings: Secondary | ICD-10-CM | POA: Diagnosis not present

## 2023-09-23 DIAGNOSIS — M8588 Other specified disorders of bone density and structure, other site: Secondary | ICD-10-CM | POA: Diagnosis not present

## 2023-09-23 DIAGNOSIS — F419 Anxiety disorder, unspecified: Secondary | ICD-10-CM | POA: Diagnosis not present

## 2023-09-23 DIAGNOSIS — E785 Hyperlipidemia, unspecified: Secondary | ICD-10-CM | POA: Diagnosis not present

## 2023-09-23 DIAGNOSIS — R300 Dysuria: Secondary | ICD-10-CM | POA: Diagnosis not present

## 2023-09-23 DIAGNOSIS — I471 Supraventricular tachycardia, unspecified: Secondary | ICD-10-CM | POA: Diagnosis not present

## 2023-10-09 DIAGNOSIS — F411 Generalized anxiety disorder: Secondary | ICD-10-CM | POA: Diagnosis not present

## 2023-10-09 DIAGNOSIS — F332 Major depressive disorder, recurrent severe without psychotic features: Secondary | ICD-10-CM | POA: Diagnosis not present

## 2023-10-09 DIAGNOSIS — Z630 Problems in relationship with spouse or partner: Secondary | ICD-10-CM | POA: Diagnosis not present

## 2023-10-20 DIAGNOSIS — Z85828 Personal history of other malignant neoplasm of skin: Secondary | ICD-10-CM | POA: Diagnosis not present

## 2023-10-20 DIAGNOSIS — C44311 Basal cell carcinoma of skin of nose: Secondary | ICD-10-CM | POA: Diagnosis not present

## 2023-10-25 DIAGNOSIS — F4322 Adjustment disorder with anxiety: Secondary | ICD-10-CM | POA: Diagnosis not present

## 2023-11-10 DIAGNOSIS — S61411A Laceration without foreign body of right hand, initial encounter: Secondary | ICD-10-CM | POA: Diagnosis not present

## 2023-11-10 DIAGNOSIS — Z792 Long term (current) use of antibiotics: Secondary | ICD-10-CM | POA: Diagnosis not present

## 2023-11-25 DIAGNOSIS — F4322 Adjustment disorder with anxiety: Secondary | ICD-10-CM | POA: Diagnosis not present

## 2024-01-29 DIAGNOSIS — F411 Generalized anxiety disorder: Secondary | ICD-10-CM | POA: Diagnosis not present

## 2024-01-29 DIAGNOSIS — Z630 Problems in relationship with spouse or partner: Secondary | ICD-10-CM | POA: Diagnosis not present

## 2024-01-29 DIAGNOSIS — F332 Major depressive disorder, recurrent severe without psychotic features: Secondary | ICD-10-CM | POA: Diagnosis not present

## 2024-02-19 DIAGNOSIS — R3915 Urgency of urination: Secondary | ICD-10-CM | POA: Diagnosis not present

## 2024-02-19 DIAGNOSIS — R300 Dysuria: Secondary | ICD-10-CM | POA: Diagnosis not present

## 2024-03-03 ENCOUNTER — Other Ambulatory Visit: Payer: Self-pay | Admitting: Cardiology

## 2024-03-24 DIAGNOSIS — G4700 Insomnia, unspecified: Secondary | ICD-10-CM | POA: Diagnosis not present

## 2024-03-24 DIAGNOSIS — F419 Anxiety disorder, unspecified: Secondary | ICD-10-CM | POA: Diagnosis not present

## 2024-03-24 DIAGNOSIS — M19049 Primary osteoarthritis, unspecified hand: Secondary | ICD-10-CM | POA: Diagnosis not present

## 2024-03-24 DIAGNOSIS — I471 Supraventricular tachycardia, unspecified: Secondary | ICD-10-CM | POA: Diagnosis not present

## 2024-03-24 DIAGNOSIS — F339 Major depressive disorder, recurrent, unspecified: Secondary | ICD-10-CM | POA: Diagnosis not present

## 2024-03-24 DIAGNOSIS — I10 Essential (primary) hypertension: Secondary | ICD-10-CM | POA: Diagnosis not present

## 2024-03-24 DIAGNOSIS — Z91038 Other insect allergy status: Secondary | ICD-10-CM | POA: Diagnosis not present

## 2024-03-24 DIAGNOSIS — E785 Hyperlipidemia, unspecified: Secondary | ICD-10-CM | POA: Diagnosis not present

## 2024-04-29 DIAGNOSIS — F4323 Adjustment disorder with mixed anxiety and depressed mood: Secondary | ICD-10-CM | POA: Diagnosis not present

## 2024-04-29 DIAGNOSIS — F419 Anxiety disorder, unspecified: Secondary | ICD-10-CM | POA: Diagnosis not present

## 2024-04-29 DIAGNOSIS — F330 Major depressive disorder, recurrent, mild: Secondary | ICD-10-CM | POA: Diagnosis not present

## 2024-05-24 ENCOUNTER — Other Ambulatory Visit: Payer: Medicare HMO

## 2024-07-13 DIAGNOSIS — Z9181 History of falling: Secondary | ICD-10-CM | POA: Diagnosis not present

## 2024-07-13 DIAGNOSIS — M1712 Unilateral primary osteoarthritis, left knee: Secondary | ICD-10-CM | POA: Diagnosis not present

## 2024-07-13 DIAGNOSIS — M7989 Other specified soft tissue disorders: Secondary | ICD-10-CM | POA: Diagnosis not present

## 2024-07-13 DIAGNOSIS — M25562 Pain in left knee: Secondary | ICD-10-CM | POA: Diagnosis not present

## 2024-07-28 ENCOUNTER — Other Ambulatory Visit: Payer: Self-pay | Admitting: Cardiology

## 2024-08-03 DIAGNOSIS — M25562 Pain in left knee: Secondary | ICD-10-CM | POA: Diagnosis not present

## 2024-08-03 DIAGNOSIS — R29898 Other symptoms and signs involving the musculoskeletal system: Secondary | ICD-10-CM | POA: Diagnosis not present

## 2024-08-12 DIAGNOSIS — F330 Major depressive disorder, recurrent, mild: Secondary | ICD-10-CM | POA: Diagnosis not present

## 2024-09-07 ENCOUNTER — Other Ambulatory Visit: Payer: Self-pay | Admitting: Cardiology

## 2024-09-07 DIAGNOSIS — L821 Other seborrheic keratosis: Secondary | ICD-10-CM | POA: Diagnosis not present

## 2024-09-07 DIAGNOSIS — D1801 Hemangioma of skin and subcutaneous tissue: Secondary | ICD-10-CM | POA: Diagnosis not present

## 2024-09-07 DIAGNOSIS — Z85828 Personal history of other malignant neoplasm of skin: Secondary | ICD-10-CM | POA: Diagnosis not present

## 2024-09-07 DIAGNOSIS — L565 Disseminated superficial actinic porokeratosis (DSAP): Secondary | ICD-10-CM | POA: Diagnosis not present

## 2024-09-07 DIAGNOSIS — L84 Corns and callosities: Secondary | ICD-10-CM | POA: Diagnosis not present

## 2024-10-02 ENCOUNTER — Other Ambulatory Visit: Payer: Self-pay | Admitting: Cardiology

## 2024-10-05 ENCOUNTER — Other Ambulatory Visit (HOSPITAL_BASED_OUTPATIENT_CLINIC_OR_DEPARTMENT_OTHER): Payer: Self-pay | Admitting: Family Medicine

## 2024-10-05 DIAGNOSIS — F419 Anxiety disorder, unspecified: Secondary | ICD-10-CM | POA: Diagnosis not present

## 2024-10-05 DIAGNOSIS — E785 Hyperlipidemia, unspecified: Secondary | ICD-10-CM | POA: Diagnosis not present

## 2024-10-05 DIAGNOSIS — I10 Essential (primary) hypertension: Secondary | ICD-10-CM | POA: Diagnosis not present

## 2024-10-05 DIAGNOSIS — M8580 Other specified disorders of bone density and structure, unspecified site: Secondary | ICD-10-CM

## 2024-10-05 DIAGNOSIS — F339 Major depressive disorder, recurrent, unspecified: Secondary | ICD-10-CM | POA: Diagnosis not present

## 2024-10-05 DIAGNOSIS — I471 Supraventricular tachycardia, unspecified: Secondary | ICD-10-CM | POA: Diagnosis not present

## 2024-10-05 DIAGNOSIS — Z0000 Encounter for general adult medical examination without abnormal findings: Secondary | ICD-10-CM | POA: Diagnosis not present

## 2024-10-05 DIAGNOSIS — E538 Deficiency of other specified B group vitamins: Secondary | ICD-10-CM | POA: Diagnosis not present

## 2024-10-05 DIAGNOSIS — G4700 Insomnia, unspecified: Secondary | ICD-10-CM | POA: Diagnosis not present

## 2024-10-05 DIAGNOSIS — M8588 Other specified disorders of bone density and structure, other site: Secondary | ICD-10-CM | POA: Diagnosis not present

## 2024-10-05 DIAGNOSIS — Z23 Encounter for immunization: Secondary | ICD-10-CM | POA: Diagnosis not present

## 2024-10-06 DIAGNOSIS — G43909 Migraine, unspecified, not intractable, without status migrainosus: Secondary | ICD-10-CM | POA: Diagnosis not present

## 2024-10-06 DIAGNOSIS — I2510 Atherosclerotic heart disease of native coronary artery without angina pectoris: Secondary | ICD-10-CM | POA: Diagnosis not present

## 2024-10-06 DIAGNOSIS — H269 Unspecified cataract: Secondary | ICD-10-CM | POA: Diagnosis not present

## 2024-10-06 DIAGNOSIS — Z8249 Family history of ischemic heart disease and other diseases of the circulatory system: Secondary | ICD-10-CM | POA: Diagnosis not present

## 2024-10-06 DIAGNOSIS — R32 Unspecified urinary incontinence: Secondary | ICD-10-CM | POA: Diagnosis not present

## 2024-10-06 DIAGNOSIS — E785 Hyperlipidemia, unspecified: Secondary | ICD-10-CM | POA: Diagnosis not present

## 2024-10-06 DIAGNOSIS — G4700 Insomnia, unspecified: Secondary | ICD-10-CM | POA: Diagnosis not present

## 2024-10-06 DIAGNOSIS — Z809 Family history of malignant neoplasm, unspecified: Secondary | ICD-10-CM | POA: Diagnosis not present

## 2024-10-06 DIAGNOSIS — Z882 Allergy status to sulfonamides status: Secondary | ICD-10-CM | POA: Diagnosis not present

## 2024-10-06 DIAGNOSIS — Z85828 Personal history of other malignant neoplasm of skin: Secondary | ICD-10-CM | POA: Diagnosis not present

## 2024-10-06 DIAGNOSIS — M1990 Unspecified osteoarthritis, unspecified site: Secondary | ICD-10-CM | POA: Diagnosis not present

## 2024-10-06 DIAGNOSIS — N182 Chronic kidney disease, stage 2 (mild): Secondary | ICD-10-CM | POA: Diagnosis not present

## 2024-10-06 DIAGNOSIS — Z888 Allergy status to other drugs, medicaments and biological substances status: Secondary | ICD-10-CM | POA: Diagnosis not present

## 2024-10-06 DIAGNOSIS — M8580 Other specified disorders of bone density and structure, unspecified site: Secondary | ICD-10-CM | POA: Diagnosis not present

## 2024-10-06 DIAGNOSIS — F324 Major depressive disorder, single episode, in partial remission: Secondary | ICD-10-CM | POA: Diagnosis not present

## 2024-10-06 DIAGNOSIS — I129 Hypertensive chronic kidney disease with stage 1 through stage 4 chronic kidney disease, or unspecified chronic kidney disease: Secondary | ICD-10-CM | POA: Diagnosis not present

## 2024-10-06 DIAGNOSIS — F411 Generalized anxiety disorder: Secondary | ICD-10-CM | POA: Diagnosis not present

## 2024-10-06 DIAGNOSIS — M519 Unspecified thoracic, thoracolumbar and lumbosacral intervertebral disc disorder: Secondary | ICD-10-CM | POA: Diagnosis not present

## 2024-10-12 DIAGNOSIS — M25572 Pain in left ankle and joints of left foot: Secondary | ICD-10-CM | POA: Diagnosis not present

## 2024-10-12 DIAGNOSIS — M25562 Pain in left knee: Secondary | ICD-10-CM | POA: Diagnosis not present

## 2024-10-12 DIAGNOSIS — G8929 Other chronic pain: Secondary | ICD-10-CM | POA: Diagnosis not present

## 2024-10-13 ENCOUNTER — Other Ambulatory Visit (HOSPITAL_BASED_OUTPATIENT_CLINIC_OR_DEPARTMENT_OTHER): Payer: Self-pay | Admitting: Family Medicine

## 2024-10-13 DIAGNOSIS — Z1231 Encounter for screening mammogram for malignant neoplasm of breast: Secondary | ICD-10-CM

## 2024-10-14 DIAGNOSIS — K429 Umbilical hernia without obstruction or gangrene: Secondary | ICD-10-CM | POA: Diagnosis not present

## 2024-10-15 ENCOUNTER — Other Ambulatory Visit: Payer: Self-pay | Admitting: General Surgery

## 2024-10-15 DIAGNOSIS — K429 Umbilical hernia without obstruction or gangrene: Secondary | ICD-10-CM

## 2024-10-19 ENCOUNTER — Ambulatory Visit
Admission: RE | Admit: 2024-10-19 | Discharge: 2024-10-19 | Disposition: A | Discharge status: Home / Self Care | Source: Ambulatory Visit | Attending: General Surgery | Admitting: General Surgery

## 2024-10-19 DIAGNOSIS — K429 Umbilical hernia without obstruction or gangrene: Secondary | ICD-10-CM

## 2024-10-25 ENCOUNTER — Ambulatory Visit: Payer: Self-pay | Admitting: General Surgery

## 2024-12-05 ENCOUNTER — Other Ambulatory Visit: Payer: Self-pay | Admitting: Cardiology

## 2024-12-29 ENCOUNTER — Telehealth: Payer: Self-pay | Admitting: Cardiology

## 2024-12-29 MED ORDER — METOPROLOL SUCCINATE ER 25 MG PO TB24: ORAL_TABLET | ORAL | 3 refills | Status: AC

## 2024-12-29 NOTE — Telephone Encounter (Signed)
" °*  STAT* If patient is at the pharmacy, call can be transferred to refill team.   1. Which medications need to be refilled? (please list name of each medication and dose if known) metoprolol  succinate (TOPROL -XL) 25 MG 24 hr tablet    2. Would you like to learn more about the convenience, safety, & potential cost savings by using the [facility]? No     3. Are you open to using the [facility] (Type [facility]. ). No    4. Which pharmacy/location (including street and city if local pharmacy) is medication to be sent to? [id number] - [place], [place] - [address] AT [facility] OF [address]     5. Do they need a 30 day or 90 day supply? 90   Pt has upcoming appt   "

## 2024-12-29 NOTE — Telephone Encounter (Signed)
 Appt scheduled for 05/26. Refill sent to last patient until appt.

## 2025-01-11 ENCOUNTER — Ambulatory Visit (HOSPITAL_BASED_OUTPATIENT_CLINIC_OR_DEPARTMENT_OTHER)

## 2025-01-11 ENCOUNTER — Other Ambulatory Visit (HOSPITAL_BASED_OUTPATIENT_CLINIC_OR_DEPARTMENT_OTHER)

## 2025-04-19 ENCOUNTER — Ambulatory Visit: Admitting: Cardiology
